# Patient Record
Sex: Female | Born: 1945 | Race: White | Hispanic: No | Smoking: Former smoker
Health system: Southern US, Community
[De-identification: ages and names within clinical notes are randomized; demographics above are authoritative.]

## PROBLEM LIST (undated history)

## (undated) DIAGNOSIS — Z8739 Personal history of other diseases of the musculoskeletal system and connective tissue: Secondary | ICD-10-CM

## (undated) DIAGNOSIS — E559 Vitamin D deficiency, unspecified: Secondary | ICD-10-CM

## (undated) DIAGNOSIS — R0683 Snoring: Secondary | ICD-10-CM

## (undated) DIAGNOSIS — F5104 Psychophysiologic insomnia: Secondary | ICD-10-CM

## (undated) DIAGNOSIS — G2581 Restless legs syndrome: Secondary | ICD-10-CM

## (undated) DIAGNOSIS — E039 Hypothyroidism, unspecified: Secondary | ICD-10-CM

## (undated) DIAGNOSIS — M8580 Other specified disorders of bone density and structure, unspecified site: Secondary | ICD-10-CM

## (undated) DIAGNOSIS — I10 Essential (primary) hypertension: Secondary | ICD-10-CM

## (undated) DIAGNOSIS — J302 Other seasonal allergic rhinitis: Secondary | ICD-10-CM

## (undated) DIAGNOSIS — M5412 Radiculopathy, cervical region: Secondary | ICD-10-CM

## (undated) DIAGNOSIS — I4811 Longstanding persistent atrial fibrillation: Secondary | ICD-10-CM

## (undated) HISTORY — DX: Other specified disorders of bone density and structure, unspecified site: M85.80

## (undated) HISTORY — DX: Snoring: R06.83

## (undated) HISTORY — PX: CHOLECYSTECTOMY: SHX55

## (undated) HISTORY — DX: Vitamin D deficiency, unspecified: E55.9

## (undated) HISTORY — PX: OTHER SURGICAL HISTORY: SHX169

## (undated) HISTORY — DX: Personal history of other diseases of the musculoskeletal system and connective tissue: Z87.39

## (undated) HISTORY — DX: Other seasonal allergic rhinitis: J30.2

## (undated) HISTORY — PX: DILATION AND CURETTAGE OF UTERUS: SHX78

## (undated) HISTORY — DX: Hypothyroidism, unspecified: E03.9

## (undated) HISTORY — DX: Psychophysiologic insomnia: F51.04

## (undated) HISTORY — DX: Radiculopathy, cervical region: M54.12

## (undated) HISTORY — DX: Restless legs syndrome: G25.81

## (undated) HISTORY — DX: Essential (primary) hypertension: I10

## (undated) HISTORY — DX: Longstanding persistent atrial fibrillation: I48.11

---

## 2014-08-20 ENCOUNTER — Other Ambulatory Visit: Payer: Self-pay | Admitting: Family Medicine

## 2014-08-20 DIAGNOSIS — Z1231 Encounter for screening mammogram for malignant neoplasm of breast: Secondary | ICD-10-CM

## 2014-08-22 ENCOUNTER — Ambulatory Visit
Admission: RE | Admit: 2014-08-22 | Discharge: 2014-08-22 | Disposition: A | Discharge status: Home / Self Care | Payer: Medicare Other | Source: Ambulatory Visit | Attending: Family Medicine | Admitting: Family Medicine

## 2014-08-22 ENCOUNTER — Encounter (INDEPENDENT_AMBULATORY_CARE_PROVIDER_SITE_OTHER): Payer: Self-pay

## 2014-08-22 DIAGNOSIS — Z1231 Encounter for screening mammogram for malignant neoplasm of breast: Secondary | ICD-10-CM

## 2016-02-29 ENCOUNTER — Ambulatory Visit (INDEPENDENT_AMBULATORY_CARE_PROVIDER_SITE_OTHER): Payer: Medicare Other | Admitting: Internal Medicine

## 2016-02-29 ENCOUNTER — Encounter (INDEPENDENT_AMBULATORY_CARE_PROVIDER_SITE_OTHER): Payer: Self-pay

## 2016-02-29 ENCOUNTER — Encounter: Payer: Self-pay | Admitting: Internal Medicine

## 2016-02-29 VITALS — BP 118/62 | HR 85 | Ht 61.0 in | Wt 166.0 lb

## 2016-02-29 DIAGNOSIS — I10 Essential (primary) hypertension: Secondary | ICD-10-CM

## 2016-02-29 DIAGNOSIS — I4891 Unspecified atrial fibrillation: Secondary | ICD-10-CM | POA: Diagnosis not present

## 2016-02-29 DIAGNOSIS — I481 Persistent atrial fibrillation: Secondary | ICD-10-CM

## 2016-02-29 DIAGNOSIS — I4819 Other persistent atrial fibrillation: Secondary | ICD-10-CM

## 2016-02-29 NOTE — Progress Notes (Signed)
Electrophysiology Office Note   Date:  02/29/2016   ID:  Cassidy CarinaShirley A Benavides, DOB 14-Jun-1945, MRN [id number]  PCP:  Thora LanceEHINGER,ROBERT R, MD  Cardiologist:  Dr Arita MissPace in [place] (years ago) Primary Electrophysiologist: Hillis RangeJames Findley Blankenbaker, MD    Chief Complaint  Patient presents with  . New Patient (Initial Visit)  . Atrial Fibrillation     History of Present Illness: Cassidy Rodriguez is a 70 y.o. female who presents today for electrophysiology evaluation.   She reports initially being diagnosed with atrial fibrillation 8-9 years ago.  This was diagnosed with afib in [place] (where she was living) after presenting with palpitations, wheezing, and edema.  She had decreased exercise tolerance at that time.  She was hospitalized and initiated on digoxin, atenolol and coumadin.  She did not have cardioversion.  She has done well since that time.  She is unaware of her afib.  She has probably been persistently in afib for several years.  Her coumadin has been switched to pradaxa several years ago and atenolol switched to metoprolol.  Her pradaxa was switched to xarelto due to once daily dosing but did not have issues with pradaxa previously.  She is pleased with her current health state.  She knows several people that have had afib ablation.  She presents to inquire about ablation to "get off of medicines".   Today, she denies symptoms of palpitations, chest pain, shortness of breath, orthopnea, PND, lower extremity edema, claudication, dizziness, presyncope, syncope, bleeding, or neurologic sequela. The patient is tolerating medications without difficulties and is otherwise without complaint today.    Past Medical History:  Diagnosis Date  . Brachial neuritis   . Chronic insomnia   . History of osteopenia   . Hypertension   . Hypothyroidism   . Osteopenia   . Persistent atrial fibrillation (HCC)   . Restless leg   . Seasonal allergies   . Vitamin D deficiency    Past Surgical History:  Procedure  Laterality Date  . BTL    . CHOLECYSTECTOMY    . DILATION AND CURETTAGE OF UTERUS       Current Outpatient Prescriptions  Medication Sig Dispense Refill  . Cholecalciferol (VITAMIN D3) 5000 units CAPS Take by mouth 3 (three) times daily.    Marland Kitchen. DIGOXIN PO Take 250 mcg by mouth daily.    Marland Kitchen. doxepin (SINEQUAN) 10 MG capsule Take 10 mg by mouth at bedtime as needed (1-2 CAPSULE).    Marland Kitchen. levothyroxine (SYNTHROID, LEVOTHROID) 75 MCG tablet Take 75 mcg by mouth daily before breakfast.    . METOPROLOL SUCCINATE ER PO Take 50 mg by mouth daily.    . rivaroxaban (XARELTO) 20 MG TABS tablet Take 20 mg by mouth daily with supper.     No current facility-administered medications for this visit.     Allergies:   Compazine [prochlorperazine edisylate]   Social History:  The patient  reports that she quit smoking about 27 years ago. She has never used smokeless tobacco. She reports that she drinks alcohol. She reports that she does not use drugs.   Family History:  The patient's  family history includes Colon polyps in her sister.    ROS:  Please see the history of present illness.   All other systems are reviewed and negative.    PHYSICAL EXAM: VS:  BP 118/62   Pulse 85   Ht 5\' 1"  (1.549 m)   Wt 166 lb (75.3 kg)   LMP  (LMP Unknown)   BMI  31.37 kg/m  , BMI Body mass index is 31.37 kg/m. GEN: Well nourished, well developed, in no acute distress  HEENT: normal  Neck: no JVD, carotid bruits, or masses Cardiac: iRRR; no murmurs, rubs, or gallops,no edema  Respiratory:  clear to auscultation bilaterally, normal work of breathing GI: soft, nontender, nondistended, + BS MS: no deformity or atrophy  Skin: warm and dry  Neuro:  Strength and sensation are intact Psych: euthymic mood, full affect  EKG:  EKG is ordered today. The ekg ordered today shows afib,  V rate 85 bpm, RBBB, LAD   Wt Readings from Last 3 Encounters:  02/29/16 166 lb (75.3 kg)      Other studies Reviewed: Additional  studies/ records that were reviewed today include: Dr Randel BooksEhinger's records  Review of the above records today demonstrates: as above   ASSESSMENT AND PLAN:  1.  Longstanding persistent atrial fibrillation The patient has asymptomatic and rate controlled afib.  She has been in afib for several years.  She is doing well currently. chads2vasc score is at least 3.  She is appropriately anticoagulated with xarelto.  She presents today for discussion of AF ablation simply to get off of anticoagulation/ medicines. Therapeutic strategies for afib including rate control and rhythm control were discussed in detail with the patient today. Risk, benefits, and alternatives to EP study and radiofrequency ablation for afib were also discussed in detail today.  I have explained that the only approved indication for AF ablation is for symptomatic relief.  I have also explained that given her longstanding persistent afib, her success with ablation would be low (<50% with multiple procedures required).  I was very clear today that she should continue her current medicines and not pursue ablation.  I did discuss Watchman LAAO as a possible alternative to anticoagulation.  She will consider this but admits that other than cost that she is doing well with xarelto. No changes are made today Would advise dig level with PCP every 6 months Echo to evaluate for structural changes related to AF.  2. Snoring I have encouraged her to discuss possibility of sleep study with PCP  3. HTN Stable No change required today  Follow-up in AF clinic every 6 months.  I will see as needed  Current medicines are reviewed at length with the patient today.   The patient does not have concerns regarding her medicines.  The following changes were made today:  none  Signed, Hillis RangeJames Keonia Pasko, MD  02/29/2016 3:53 PM     [facility] [address] [place] [address] [phone] (office) [phone] (fax)

## 2016-02-29 NOTE — Patient Instructions (Signed)
Medication Instructions:  Your physician recommends that you continue on your current medications as directed. Please refer to the Current Medication list given to you today.    Labwork: None ordered   Testing/Procedures: Your physician has requested that you have an echocardiogram. Echocardiography is a painless test that uses sound waves to create images of your heart. It provides your doctor with information about the size and shape of your heart and how well your heart's chambers and valves are working. This procedure takes approximately one hour. There are no restrictions for this procedure.    Follow-Up: Your physician wants you to follow-up in: 6 months with Rudi Cocoonna Carroll, NP  You will receive a reminder letter in the mail two months in advance. If you don't receive a letter, please call our office to schedule the follow-up appointment.    Any Other Special Instructions Will Be Listed Below (If Applicable).     If you need a refill on your cardiac medications before your next appointment, please call your pharmacy.

## 2016-03-21 ENCOUNTER — Other Ambulatory Visit: Payer: Self-pay

## 2016-03-21 ENCOUNTER — Ambulatory Visit (HOSPITAL_COMMUNITY): Payer: Medicare Other | Attending: Cardiovascular Disease

## 2016-03-21 DIAGNOSIS — I119 Hypertensive heart disease without heart failure: Secondary | ICD-10-CM | POA: Insufficient documentation

## 2016-03-21 DIAGNOSIS — Z87891 Personal history of nicotine dependence: Secondary | ICD-10-CM | POA: Diagnosis not present

## 2016-03-21 DIAGNOSIS — I4891 Unspecified atrial fibrillation: Secondary | ICD-10-CM

## 2016-03-21 DIAGNOSIS — I340 Nonrheumatic mitral (valve) insufficiency: Secondary | ICD-10-CM | POA: Insufficient documentation

## 2016-07-08 ENCOUNTER — Telehealth: Payer: Self-pay | Admitting: Internal Medicine

## 2016-07-08 NOTE — Telephone Encounter (Signed)
Follow Up;   Cassidy Rodriguez says she faxed over 3 clearance and have not received one back yet.Please fax,asap,pt is scheduled for surgery on Tuesday.

## 2016-07-08 NOTE — Telephone Encounter (Signed)
Recommendations faxed over this morning

## 2016-08-19 ENCOUNTER — Telehealth (HOSPITAL_COMMUNITY): Payer: Self-pay | Admitting: *Deleted

## 2016-08-19 NOTE — Telephone Encounter (Signed)
I called pt to schedule 6 month recall.  Pt declined the appt at this time stating that since she is not having any problem she does not feel the appt to be necessary.  Pt advised to keep in contact with Dr. Amedeo Plenty office to keep follow ups for medication refills and general follow up

## 2017-02-15 ENCOUNTER — Ambulatory Visit (INDEPENDENT_AMBULATORY_CARE_PROVIDER_SITE_OTHER): Payer: Medicare Other | Admitting: Podiatry

## 2017-02-15 ENCOUNTER — Ambulatory Visit (INDEPENDENT_AMBULATORY_CARE_PROVIDER_SITE_OTHER): Payer: Medicare Other

## 2017-02-15 ENCOUNTER — Encounter: Payer: Self-pay | Admitting: Podiatry

## 2017-02-15 DIAGNOSIS — M722 Plantar fascial fibromatosis: Secondary | ICD-10-CM | POA: Diagnosis not present

## 2017-02-15 DIAGNOSIS — G4700 Insomnia, unspecified: Secondary | ICD-10-CM | POA: Insufficient documentation

## 2017-02-15 DIAGNOSIS — E559 Vitamin D deficiency, unspecified: Secondary | ICD-10-CM | POA: Insufficient documentation

## 2017-02-15 DIAGNOSIS — G4726 Circadian rhythm sleep disorder, shift work type: Secondary | ICD-10-CM | POA: Insufficient documentation

## 2017-02-15 DIAGNOSIS — I10 Essential (primary) hypertension: Secondary | ICD-10-CM | POA: Insufficient documentation

## 2017-02-15 DIAGNOSIS — I4891 Unspecified atrial fibrillation: Secondary | ICD-10-CM | POA: Insufficient documentation

## 2017-02-15 DIAGNOSIS — E039 Hypothyroidism, unspecified: Secondary | ICD-10-CM | POA: Insufficient documentation

## 2017-02-15 DIAGNOSIS — M8580 Other specified disorders of bone density and structure, unspecified site: Secondary | ICD-10-CM | POA: Insufficient documentation

## 2017-02-15 DIAGNOSIS — G479 Sleep disorder, unspecified: Secondary | ICD-10-CM | POA: Insufficient documentation

## 2017-02-15 DIAGNOSIS — G2581 Restless legs syndrome: Secondary | ICD-10-CM | POA: Insufficient documentation

## 2017-02-15 MED ORDER — NONFORMULARY OR COMPOUNDED ITEM: 120.0000 g | Freq: Four times a day (QID) | 2 refills | Status: DC

## 2017-02-15 MED ORDER — BETAMETHASONE SOD PHOS & ACET 6 (3-3) MG/ML IJ SUSP: 3.0000 mg | Freq: Once | INTRAMUSCULAR | Status: DC

## 2017-02-18 NOTE — Progress Notes (Signed)
   Subjective: Patient presents today for burning, throbbing pain and tenderness in the right heel that began about one month ago. Patient states that it hurts in the mornings with the first steps out of bed. She also reports that she began Warfarin about three months ago and has been experiencing burning in the RLE since. Patient presents today for further treatment and evaluation.   Past Medical History:  Diagnosis Date  . Brachial neuritis   . Chronic insomnia   . History of osteopenia   . Hypertension   . Hypothyroidism   . Longstanding persistent atrial fibrillation (HCC)   . Osteopenia   . Restless leg   . Seasonal allergies   . Snoring   . Vitamin D deficiency      Objective: Physical Exam General: The patient is alert and oriented x3 in no acute distress.  Dermatology: Skin is warm, dry and supple bilateral lower extremities. Negative for open lesions or macerations bilateral.   Vascular: Dorsalis Pedis and Posterior Tibial pulses palpable bilateral.  Capillary fill time is immediate to all digits.  Neurological: Epicritic and protective threshold intact bilateral.   Musculoskeletal: Tenderness to palpation at the medial calcaneal tubercale and through the insertion of the plantar fascia of the right foot. All other joints range of motion within normal limits bilateral. Strength 5/5 in all groups bilateral.   Radiographic exam: Normal osseous mineralization. Joint spaces preserved. No fracture/dislocation/boney destruction. Calcaneal spur present with mild thickening of plantar fascia right. No other soft tissue abnormalities or radiopaque foreign bodies.   Assessment: 1. Plantar fasciitis right 2. Pain in right foot  Plan of Care:  1. Patient evaluated. Xrays reviewed.   2. Injection of 0.5cc Celestone soluspan injected into the right plantar fascia  3. Plantar fascial band(s) dispensed 4. Instructed patient regarding therapies and modalities at home to alleviate  symptoms.  5. Cannot tolerate oral NSAIDs. Patient is on Coumadin. 6. Prescription for pain cream to be dispensed [facility]. 7. Return to clinic in 4 weeks.     Felecia ShellingBrent M. Dimetri Armitage, DPM [facility]  Dr. Felecia ShellingBrent M. Freedom Lopezperez, DPM    [address]                                        [place], [place] [address]                Office [phone]  Fax [phone]

## 2017-03-07 ENCOUNTER — Other Ambulatory Visit: Payer: Self-pay | Admitting: Family Medicine

## 2017-03-07 ENCOUNTER — Ambulatory Visit
Admission: RE | Admit: 2017-03-07 | Discharge: 2017-03-07 | Disposition: A | Discharge status: Home / Self Care | Payer: Medicare Other | Source: Ambulatory Visit | Attending: Family Medicine | Admitting: Family Medicine

## 2017-03-07 DIAGNOSIS — Z1231 Encounter for screening mammogram for malignant neoplasm of breast: Secondary | ICD-10-CM

## 2017-03-15 ENCOUNTER — Encounter: Payer: Self-pay | Admitting: Podiatry

## 2017-03-15 ENCOUNTER — Ambulatory Visit: Payer: Medicare Other | Admitting: Podiatry

## 2017-03-15 DIAGNOSIS — M722 Plantar fascial fibromatosis: Secondary | ICD-10-CM

## 2017-03-21 NOTE — Progress Notes (Signed)
   Subjective: Patient presents today for follow up evaluation of right plantar fasciitis. She states the foot pain has improved some but is still present. She states wearing the fascial band and receiving the injection at the previous visit helped alleviate the pain. She is unsure if the pain cream from [facility] has provided any relief and states it was expensive. Patient presents today for further treatment and evaluation.   Past Medical History:  Diagnosis Date  . Brachial neuritis   . Chronic insomnia   . History of osteopenia   . Hypertension   . Hypothyroidism   . Longstanding persistent atrial fibrillation (HCC)   . Osteopenia   . Restless leg   . Seasonal allergies   . Snoring   . Vitamin D deficiency      Objective: Physical Exam General: The patient is alert and oriented x3 in no acute distress.  Dermatology: Skin is warm, dry and supple bilateral lower extremities. Negative for open lesions or macerations bilateral.   Vascular: Dorsalis Pedis and Posterior Tibial pulses palpable bilateral.  Capillary fill time is immediate to all digits.  Neurological: Epicritic and protective threshold intact bilateral.   Musculoskeletal: Tenderness to palpation at the medial calcaneal tubercale and through the insertion of the plantar fascia of the right foot. All other joints range of motion within normal limits bilateral. Strength 5/5 in all groups bilateral.    Assessment: 1. Plantar fasciitis right 2. Pain in right foot  Plan of Care:  1. Patient evaluated.   2. Injection of 0.5cc Celestone soluspan injected into the right plantar fascia  3. Continue wearing plantar fascial brace and [facility] cream [facility]. 4. Cannot tolerate oral NSAIDs. Patient is on Coumadin. 5. Return to clinic in 4 weeks.     Felecia ShellingBrent M. Soraida Vickers, DPM [facility]  Dr. Felecia ShellingBrent M. Jaquille Kau, DPM    [address]                                        [place],  [place] [address]                Office [phone]  Fax [phone]

## 2017-04-12 ENCOUNTER — Ambulatory Visit: Payer: Medicare Other | Admitting: Podiatry

## 2017-09-13 ENCOUNTER — Emergency Department (HOSPITAL_COMMUNITY)
Admission: EM | Admit: 2017-09-13 | Discharge: 2017-09-14 | Disposition: A | Discharge status: Home / Self Care | Payer: Medicare Other | Attending: Emergency Medicine | Admitting: Emergency Medicine

## 2017-09-13 ENCOUNTER — Other Ambulatory Visit: Payer: Self-pay

## 2017-09-13 ENCOUNTER — Encounter (HOSPITAL_COMMUNITY): Payer: Self-pay

## 2017-09-13 DIAGNOSIS — E039 Hypothyroidism, unspecified: Secondary | ICD-10-CM | POA: Insufficient documentation

## 2017-09-13 DIAGNOSIS — N83202 Unspecified ovarian cyst, left side: Secondary | ICD-10-CM | POA: Insufficient documentation

## 2017-09-13 DIAGNOSIS — Z7901 Long term (current) use of anticoagulants: Secondary | ICD-10-CM

## 2017-09-13 DIAGNOSIS — Z79899 Other long term (current) drug therapy: Secondary | ICD-10-CM | POA: Diagnosis not present

## 2017-09-13 DIAGNOSIS — N949 Unspecified condition associated with female genital organs and menstrual cycle: Secondary | ICD-10-CM

## 2017-09-13 DIAGNOSIS — R1031 Right lower quadrant pain: Secondary | ICD-10-CM | POA: Diagnosis present

## 2017-09-13 DIAGNOSIS — Z87891 Personal history of nicotine dependence: Secondary | ICD-10-CM | POA: Insufficient documentation

## 2017-09-13 DIAGNOSIS — I10 Essential (primary) hypertension: Secondary | ICD-10-CM | POA: Diagnosis not present

## 2017-09-13 DIAGNOSIS — I4891 Unspecified atrial fibrillation: Secondary | ICD-10-CM | POA: Diagnosis not present

## 2017-09-13 LAB — URINALYSIS, ROUTINE W REFLEX MICROSCOPIC
BILIRUBIN URINE: NEGATIVE
Glucose, UA: NEGATIVE mg/dL
Hgb urine dipstick: NEGATIVE
KETONES UR: NEGATIVE mg/dL
NITRITE: NEGATIVE
PH: 5 (ref 5.0–8.0)
Protein, ur: NEGATIVE mg/dL
Specific Gravity, Urine: 1.015 (ref 1.005–1.030)

## 2017-09-13 LAB — COMPREHENSIVE METABOLIC PANEL
ALT: 14 U/L (ref 14–54)
ANION GAP: 11 (ref 5–15)
AST: 19 U/L (ref 15–41)
Albumin: 3.9 g/dL (ref 3.5–5.0)
Alkaline Phosphatase: 64 U/L (ref 38–126)
BILIRUBIN TOTAL: 0.7 mg/dL (ref 0.3–1.2)
BUN: 10 mg/dL (ref 6–20)
CO2: 22 mmol/L (ref 22–32)
Calcium: 9.4 mg/dL (ref 8.9–10.3)
Chloride: 105 mmol/L (ref 101–111)
Creatinine, Ser: 0.84 mg/dL (ref 0.44–1.00)
GFR calc Af Amer: >60 mL/min (ref >60)
Glucose, Bld: 108 mg/dL — ABNORMAL HIGH (ref 65–99)
POTASSIUM: 4.2 mmol/L (ref 3.5–5.1)
Sodium: 138 mmol/L (ref 135–145)
TOTAL PROTEIN: 7.5 g/dL (ref 6.5–8.1)

## 2017-09-13 LAB — CBC
HEMATOCRIT: 41.2 % (ref 36.0–46.0)
HEMOGLOBIN: 13.5 g/dL (ref 12.0–15.0)
MCH: 31 pg (ref 26.0–34.0)
MCHC: 32.8 g/dL (ref 30.0–36.0)
MCV: 94.5 fL (ref 78.0–100.0)
Platelets: 239 10*3/uL (ref 150–400)
RBC: 4.36 MIL/uL (ref 3.87–5.11)
RDW: 12.7 % (ref 11.5–15.5)
WBC: 4.6 10*3/uL (ref 4.0–10.5)

## 2017-09-13 LAB — LIPASE, BLOOD: Lipase: 23 U/L (ref 11–51)

## 2017-09-13 NOTE — ED Triage Notes (Signed)
Pt endorses RLQ pain since yesterday morning,. Sent by Deboraha Sprang MD for appendicitis rule out. VSS. Denies vomiting or diarrhea.

## 2017-09-14 ENCOUNTER — Emergency Department (HOSPITAL_COMMUNITY): Payer: Medicare Other

## 2017-09-14 LAB — CBC WITH DIFFERENTIAL/PLATELET
ABS IMMATURE GRANULOCYTES: 0 10*3/uL (ref 0.0–0.1)
Basophils Absolute: 0 10*3/uL (ref 0.0–0.1)
Basophils Relative: 1 %
EOS ABS: 0.2 10*3/uL (ref 0.0–0.7)
Eosinophils Relative: 4 %
HEMATOCRIT: 41.2 % (ref 36.0–46.0)
Hemoglobin: 13.6 g/dL (ref 12.0–15.0)
IMMATURE GRANULOCYTES: 0 %
LYMPHS ABS: 1.8 10*3/uL (ref 0.7–4.0)
Lymphocytes Relative: 36 %
MCH: 30.9 pg (ref 26.0–34.0)
MCHC: 33 g/dL (ref 30.0–36.0)
MCV: 93.6 fL (ref 78.0–100.0)
MONO ABS: 0.5 10*3/uL (ref 0.1–1.0)
MONOS PCT: 10 %
NEUTROS ABS: 2.5 10*3/uL (ref 1.7–7.7)
NEUTROS PCT: 49 %
Platelets: 212 10*3/uL (ref 150–400)
RBC: 4.4 MIL/uL (ref 3.87–5.11)
RDW: 12.7 % (ref 11.5–15.5)
WBC: 5.1 10*3/uL (ref 4.0–10.5)

## 2017-09-14 LAB — PROTIME-INR
INR: 1.91
Prothrombin Time: 21.7 seconds — ABNORMAL HIGH (ref 11.4–15.2)

## 2017-09-14 MED ORDER — OXYCODONE-ACETAMINOPHEN 5-325 MG PO TABS: 1.0000 | ORAL_TABLET | ORAL | 0 refills | Status: DC | PRN

## 2017-09-14 MED ORDER — IOHEXOL 300 MG/ML  SOLN
100.0000 mL | Freq: Once | INTRAMUSCULAR | Status: AC | PRN
  Administered 2017-09-14: 100 mL via INTRAVENOUS

## 2017-09-14 NOTE — ED Provider Notes (Signed)
[facility] EMERGENCY DEPARTMENT Provider Note   CSN: [id number] Arrival date & time: 09/13/17  1755     History   Chief Complaint Chief Complaint  Patient presents with  . Abdominal Pain    HPI Cassidy Rodriguez is a 72 y.o. female.  The history is provided by the patient.  Abdominal Pain    She has history of hypertension and atrial fibrillation and is anticoagulated with warfarin.  She had onset May 21 and pain in the right mid abdomen with radiation to the back.  Pain was severe and she rated it at 10/10.  There is no associated nausea or vomiting.  She denies any urinary difficulty.  She had been constipated for the past week.  She denies any diarrhea.  She denies fever or chills.  Appetite has been normal.  Pain subsided slightly on May 22, and she went into see her PCP who sent her to the ED to rule out appendicitis.  She is status post cholecystectomy.  Pain is worse with movement and with taking deep breath.  Nothing makes it better.  Past Medical History:  Diagnosis Date  . Brachial neuritis   . Chronic insomnia   . History of osteopenia   . Hypertension   . Hypothyroidism   . Longstanding persistent atrial fibrillation (HCC)   . Osteopenia   . Restless leg   . Seasonal allergies   . Snoring   . Vitamin D deficiency     Patient Active Problem List   Diagnosis Date Noted  . Atrial fibrillation (HCC) 02/15/2017  . Circadian rhythm sleep disorder, shift work type 02/15/2017  . Essential hypertension 02/15/2017  . Hypothyroidism 02/15/2017  . Insomnia 02/15/2017  . Restless legs syndrome 02/15/2017  . Senile osteopenia 02/15/2017  . Sleep disturbance 02/15/2017  . Vitamin D deficiency 02/15/2017    Past Surgical History:  Procedure Laterality Date  . BTL    . CHOLECYSTECTOMY    . DILATION AND CURETTAGE OF UTERUS       OB History   None      Home Medications    Prior to Admission medications   Medication Sig Start Date End  Date Taking? Authorizing Provider  Cholecalciferol (VITAMIN D3) 5000 units CAPS Take by mouth 3 (three) times daily.    [provider]  DIGOXIN PO Take 250 mcg by mouth daily.    [provider]  doxepin (SINEQUAN) 10 MG capsule Take 10 mg by mouth at bedtime as needed (1-2 CAPSULE).    [provider]  levothyroxine (SYNTHROID, LEVOTHROID) 75 MCG tablet Take 75 mcg by mouth daily before breakfast.    [provider]  METOPROLOL SUCCINATE ER PO Take 50 mg by mouth daily.    [provider]  NONFORMULARY OR COMPOUNDED ITEM Apply 120 g topically 4 (four) times daily. Diclofenac 3% Baclofen 2%, Bupivacaine 1%, Gabapentin 6% Ibuprofen 3%, Pentoxifylline 3% 02/15/17   Logan Bores, Larena Glassman, DPM  rivaroxaban (XARELTO) 20 MG TABS tablet Take 20 mg by mouth daily with supper.    [provider]  warfarin (COUMADIN) 5 MG tablet Take 5 mg by mouth daily.    [provider]    Family History Family History  Problem Relation Age of Onset  . Colon polyps Sister     Social History Social History   Tobacco Use  . Smoking status: Former Smoker    Last attempt to quit: 02/25/1989    Years since quitting: 28.5  .  Smokeless tobacco: Never Used  Substance Use Topics  . Alcohol use: Yes    Comment: 1-2 glasses of red wine with dinner  . Drug use: No     Allergies   Compazine [prochlorperazine edisylate]   Review of Systems Review of Systems  Gastrointestinal: Positive for abdominal pain.  All other systems reviewed and are negative.    Physical Exam Updated Vital Signs BP (!) 153/113   Pulse 76   Temp 97.8 F (36.6 C) (Oral)   Resp 13   Ht  (1.575 m)   Wt 74.8 kg (165 lb)   LMP  (LMP Unknown)   SpO2 100%   BMI 30.18 kg/m   Physical Exam  Nursing note and vitals reviewed.  72 year old female, resting comfortably and in no acute distress. Vital signs are significant for elevated blood pressure. Oxygen saturation is  100%, which is normal. Head is normocephalic and atraumatic. PERRLA, EOMI. Oropharynx is clear. Neck is nontender and supple without adenopathy or JVD. Back is nontender and there is no CVA tenderness. Lungs are clear without rales, wheezes, or rhonchi. Chest is nontender. Heart has regular rate and rhythm without murmur. Abdomen is soft, flat, with marked tenderness in the right side of the abdomen.  Maximum tenderness is at McBurney's area.  There is no rebound or guarding.  There is mild to moderate tenderness with percussion over that area.  There are no masses or hepatosplenomegaly and peristalsis is hypoactive. Extremities have no cyanosis or edema, full range of motion is present. Skin is warm and dry without rash. Neurologic: Mental status is normal, cranial nerves are intact, there are no motor or sensory deficits.  ED Treatments / Results  Labs (all labs ordered are listed, but only abnormal results are displayed) Labs Reviewed  COMPREHENSIVE METABOLIC PANEL - Abnormal; Notable for the following components:      Result Value   Glucose, Bld 108 (*)    All other components within normal limits  URINALYSIS, ROUTINE W REFLEX MICROSCOPIC - Abnormal; Notable for the following components:   APPearance CLOUDY (*)    Leukocytes, UA LARGE (*)    Bacteria, UA RARE (*)    All other components within normal limits  PROTIME-INR - Abnormal; Notable for the following components:   Prothrombin Time 21.7 (*)    All other components within normal limits  LIPASE, BLOOD  CBC  CBC WITH DIFFERENTIAL/PLATELET    Radiology Ct Abdomen Pelvis W Contrast  Result Date: 09/14/2017 CLINICAL DATA:  Right lower quadrant pain since yesterday. Appendicitis suspected. EXAM: CT ABDOMEN AND PELVIS WITH CONTRAST TECHNIQUE: Multidetector CT imaging of the abdomen and pelvis was performed using the standard protocol following bolus administration of intravenous contrast. Repeat scan due to injector malfunction.  CONTRAST:  OMNIPAQUE IOHEXOL 300 MG/ML  SOLN COMPARISON:  None. FINDINGS: Lower chest: Mild cardiomegaly with right heart dilatation. Right Bochdalek hernia contains fat and a portion of the right adrenal gland. Hepatobiliary: No focal hepatic lesion. Clips in the gallbladder fossa postcholecystectomy. No biliary dilatation. Pancreas: Parenchymal atrophy. No ductal dilatation or inflammation. Spleen: Calcified granuloma.  Normal in size. Adrenals/Urinary Tract: Right adrenal gland partially extends into a Bochdalek hernia. Left adrenal gland is normal. No hydronephrosis or perinephric edema. Homogeneous renal enhancement with symmetric excretion on delayed phase imaging. 19 mm simple cyst in the mid posterior left kidney. Urinary bladder is physiologically distended without wall thickening. Stomach/Bowel: No appendicitis. Appendix tentatively identified and normal, image 66 series 7. Regardless, no  pericecal or right lower quadrant inflammation. No bowel wall thickening, inflammatory change or obstruction. Small to moderate colonic stool burden. Stomach is nondistended. Vascular/Lymphatic: Minimal aortic atherosclerosis without aneurysm. No enlarged abdominal or pelvic lymph nodes. Reproductive: Uterus is unremarkable. Right ovary is normal. Left ovary not well visualized due to bowel in the left adnexa. 3.7 cm ovoid fluid density structure in the left lower quadrant may be an ovarian cyst versus fluid-filled bowel. Other: No free air, free fluid, or intra-abdominal fluid collection. Musculoskeletal: There are no acute or suspicious osseous abnormalities. Facet hypertrophy in the lower lumbar spine. IMPRESSION: 1. No evidence of appendicitis. No acute findings in the abdomen/pelvis. 2. Left adnexal cyst measuring 3.7 cm versus fluid-filled bowel in the left lower quadrant. Given postmenopausal status in size greater than 3 cm, recommend nonemergent pelvic ultrasound for further evaluation. This recommendation  follows ACR consensus guidelines: White Paper of the ACR Incidental Findings Committee II on Adnexal Findings. J Am Coll Radiol [phone]. 3. Incidental right Bochdalek hernia containing fat and portion of the right adrenal gland. Electronically Signed   By: Rubye Oaks M.D.   On: 09/14/2017 06:46    Procedures Procedures   Medications Ordered in ED Medications  iohexol (OMNIPAQUE) 300 MG/ML solution 100 mL (100 mLs Intravenous Contrast Given 09/14/17 0540)     Initial Impression / Assessment and Plan / ED Course  I have reviewed the triage vital signs and the nursing notes.  Pertinent labs & imaging results that were available during my care of the patient were reviewed by me and considered in my medical decision making (see chart for details).  Right-sided abdominal pain which is worrisome for appendicitis.  Labs have been drawn prior to my seeing the patient and include normal WBC and normal lipase and normal liver function tests.  Urinalysis shows no evidence of UTI, no hematuria.  Differential includes appendicitis, urolithiasis, pancreatitis, diverticulitis.  Also, with patient on warfarin, consider intra-abdominal bleeding.  Hemoglobin is stable indicating no significant bleeding.  She will be sent for CT of abdomen and pelvis.  She is currently declining any narcotic pain medication.  CT scan shows no evidence of appendicitis.  Incidental finding of 3.7 centimeter left adnexal mass with radiology recommendation for outpatient ultrasound.  I have explained this to the patient.  Because of her pain is not clear, but no evidence of serious pathology at this point.  She is discharged with a prescription for small number of oxycodone-acetaminophen tablets, told to return in 24 hours for recheck if pain is not improving.  Final Clinical Impressions(s) / ED Diagnoses   Final diagnoses:  RLQ abdominal pain  Adnexal cyst  Anticoagulated on warfarin    ED Discharge Orders         Ordered    oxyCODONE-acetaminophen (PERCOCET) 5-325 MG tablet  Every 4 hours PRN     09/14/17 0865       Dione Booze, MD 09/14/17 [phone]

## 2017-09-14 NOTE — ED Notes (Signed)
Pt ambulated to the restroom unassisted 

## 2017-09-14 NOTE — ED Notes (Signed)
D/c reviewed with patient 

## 2017-09-14 NOTE — Discharge Instructions (Addendum)
Your CT scan and blood work did not show what is causing your pain, but there is no sign of appendicitis. If pain is not improving in 24 hours, then either return to the Emergency Department, or see your doctor for a recheck.  Your CT scan showed a 3.7 cm cyst near your left ovary. Radiologist recommends you get an ultrasound to get a better look at it.

## 2017-10-06 ENCOUNTER — Other Ambulatory Visit: Payer: Self-pay | Admitting: Family Medicine

## 2017-10-06 DIAGNOSIS — N949 Unspecified condition associated with female genital organs and menstrual cycle: Secondary | ICD-10-CM

## 2017-10-17 ENCOUNTER — Ambulatory Visit
Admission: RE | Admit: 2017-10-17 | Discharge: 2017-10-17 | Disposition: A | Discharge status: Home / Self Care | Payer: Medicare Other | Source: Ambulatory Visit | Attending: Family Medicine | Admitting: Family Medicine

## 2017-10-17 DIAGNOSIS — N949 Unspecified condition associated with female genital organs and menstrual cycle: Secondary | ICD-10-CM

## 2017-12-11 ENCOUNTER — Ambulatory Visit: Payer: Medicare Other | Admitting: Podiatry

## 2017-12-11 ENCOUNTER — Ambulatory Visit (INDEPENDENT_AMBULATORY_CARE_PROVIDER_SITE_OTHER): Payer: Medicare Other

## 2017-12-11 DIAGNOSIS — M79674 Pain in right toe(s): Secondary | ICD-10-CM | POA: Diagnosis not present

## 2017-12-11 DIAGNOSIS — M7751 Other enthesopathy of right foot: Secondary | ICD-10-CM

## 2017-12-11 DIAGNOSIS — M7989 Other specified soft tissue disorders: Secondary | ICD-10-CM

## 2017-12-11 DIAGNOSIS — M779 Enthesopathy, unspecified: Secondary | ICD-10-CM | POA: Diagnosis not present

## 2017-12-13 NOTE — Progress Notes (Signed)
   HPI: 72 year old female presenting today with a chief complaint of right fifth toe pain that began several weeks ago. She states she is unable to wear any closed toe shoes anymore because it makes the pain too severe. She has not done anything for treatment. Patient is here for further evaluation and treatment.   Past Medical History:  Diagnosis Date  . Brachial neuritis   . Chronic insomnia   . History of osteopenia   . Hypertension   . Hypothyroidism   . Longstanding persistent atrial fibrillation (HCC)   . Osteopenia   . Restless leg   . Seasonal allergies   . Snoring   . Vitamin D deficiency      Physical Exam: General: The patient is alert and oriented x3 in no acute distress.  Dermatology: Skin is warm, dry and supple bilateral lower extremities. Negative for open lesions or macerations.  Vascular: Palpable pedal pulses bilaterally. No edema or erythema noted. Capillary refill within normal limits.  Neurological: Epicritic and protective threshold grossly intact bilaterally.   Musculoskeletal Exam: Pain with palpation to the right fifth toe. Range of motion within normal limits to all pedal and ankle joints bilateral. Muscle strength 5/5 in all groups bilateral.   Radiographic Exam:  Normal osseous mineralization. Joint spaces preserved. No fracture/dislocation/boney destruction.    Assessment: 1. Right fifth toe capsulitis    Plan of Care:  1. Patient evaluated. X-Rays reviewed.  2. Silicone toe cap dispensed for cushioning.  3. Recommended wide fitting shoes.  4. Return to clinic as needed.        Felecia ShellingBrent M. Nautika Cressey, DPM [facility]  Dr. Felecia ShellingBrent M. Jedadiah Abdallah, DPM    [address]                                        [place], [place] [id number]                Office [phone]  Fax [phone]

## 2018-10-10 ENCOUNTER — Other Ambulatory Visit: Payer: Self-pay | Admitting: Family Medicine

## 2018-10-10 ENCOUNTER — Other Ambulatory Visit: Payer: Self-pay

## 2018-10-10 DIAGNOSIS — N949 Unspecified condition associated with female genital organs and menstrual cycle: Secondary | ICD-10-CM

## 2018-10-23 ENCOUNTER — Ambulatory Visit
Admission: RE | Admit: 2018-10-23 | Discharge: 2018-10-23 | Disposition: A | Discharge status: Home / Self Care | Payer: Medicare Other | Source: Ambulatory Visit | Attending: Family Medicine | Admitting: Family Medicine

## 2018-10-23 DIAGNOSIS — N949 Unspecified condition associated with female genital organs and menstrual cycle: Secondary | ICD-10-CM

## 2019-04-12 ENCOUNTER — Other Ambulatory Visit: Payer: Self-pay | Admitting: Family Medicine

## 2019-04-12 DIAGNOSIS — Z1231 Encounter for screening mammogram for malignant neoplasm of breast: Secondary | ICD-10-CM

## 2019-04-22 IMAGING — US US PELVIS COMPLETE TRANSABD/TRANSVAG
1 series · 14 of 25 positions shown · non-contrast
Comparison: 09/14/2017

CLINICAL DATA: Left adnexal cyst on prior CT



[Series 1: us pelvis complete transabd/transvag · 0.22mm/px · 14 of 48 slices shown]
[im 1/48]
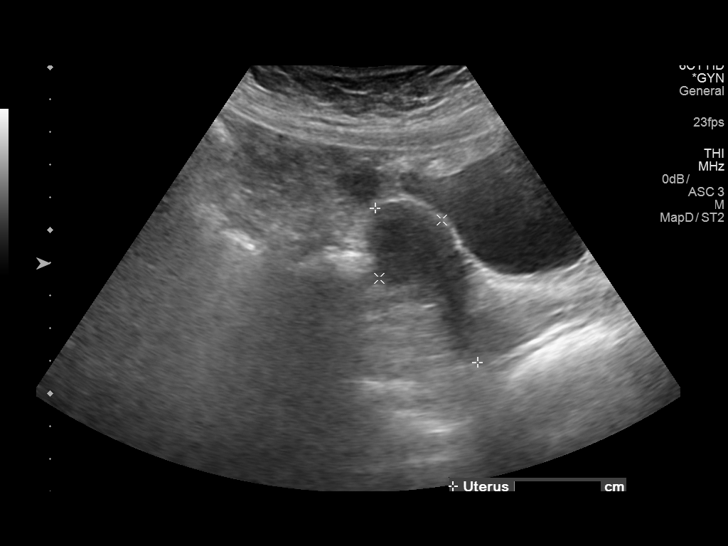
[im 4/48]
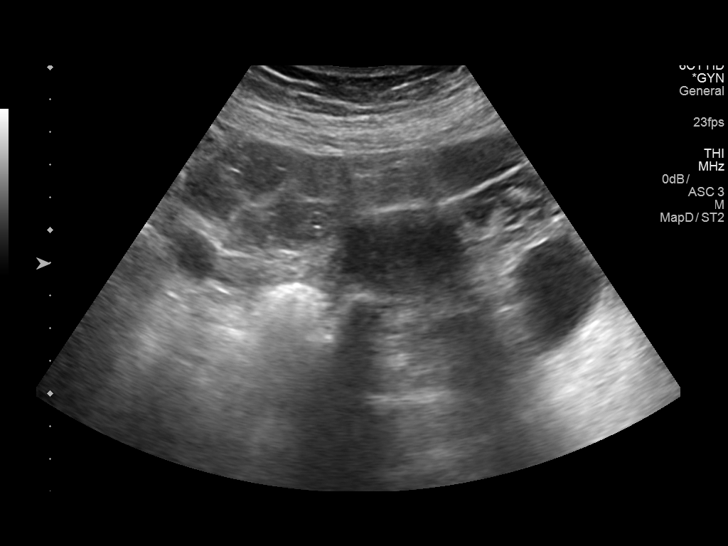
[im 8/48]
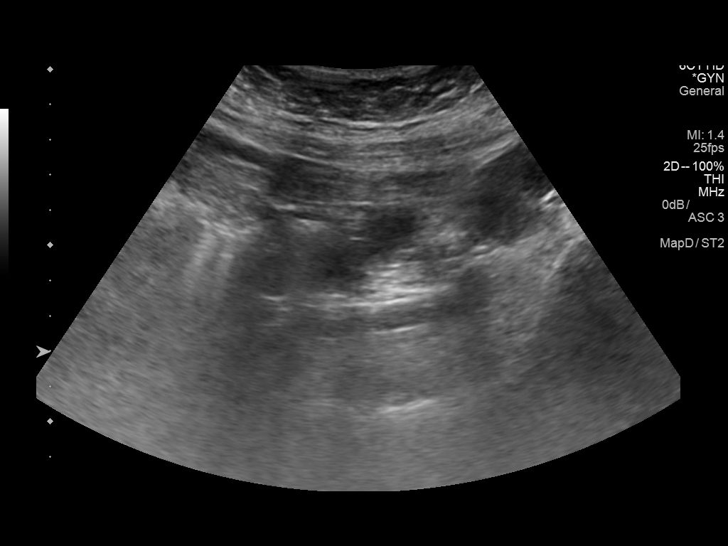
[im 12/48]
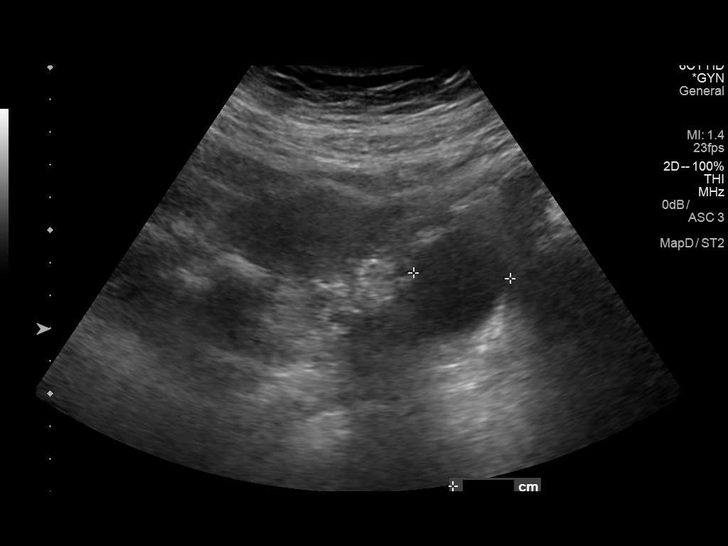
[im 16/48]
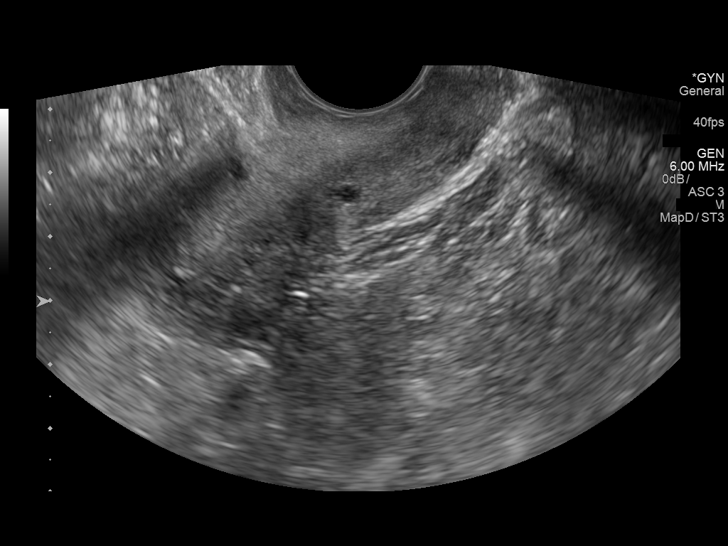
[im 18/48]
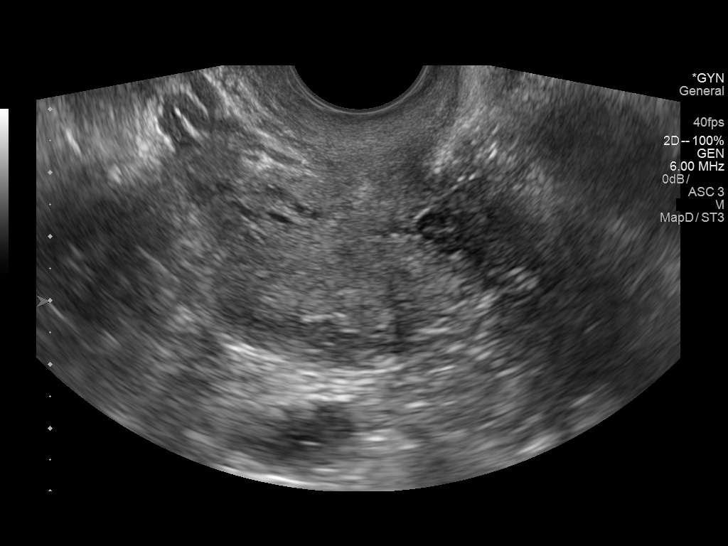
[im 22/48]
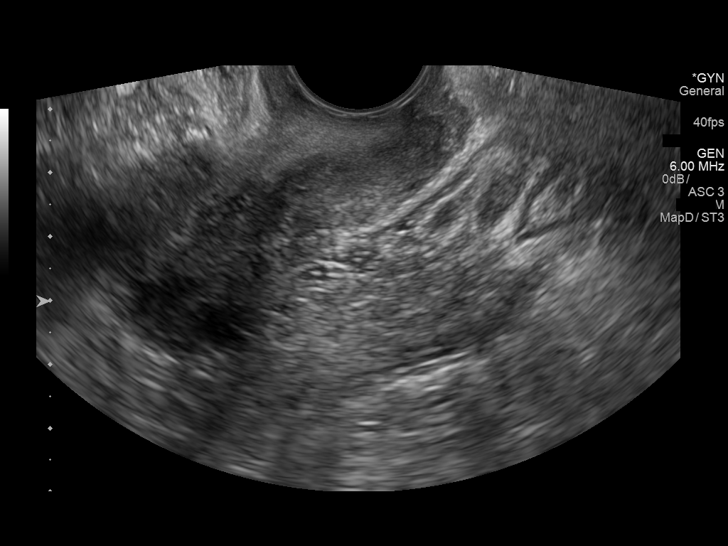
[im 26/48]
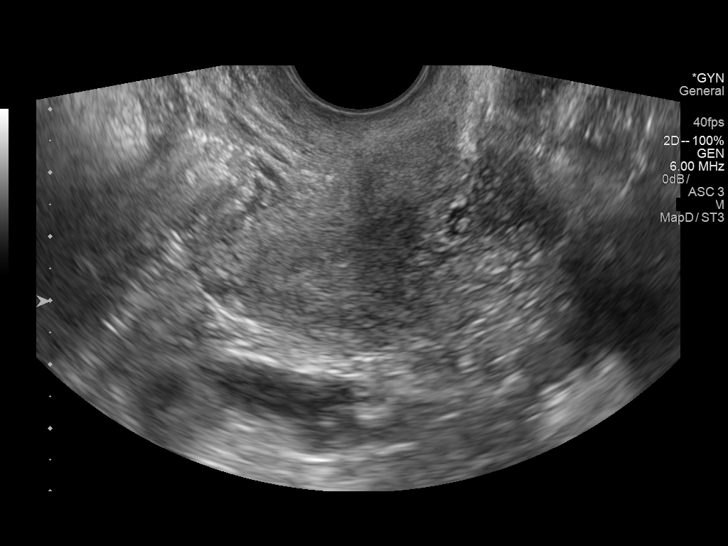
[im 30/48]
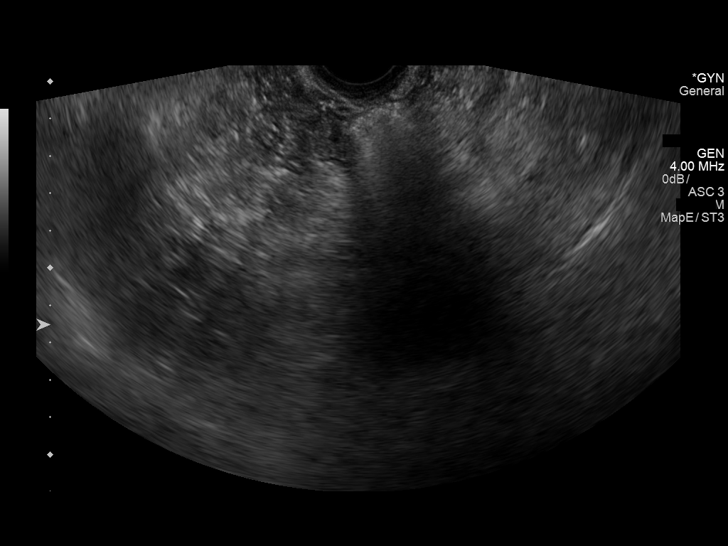
[im 32/48]
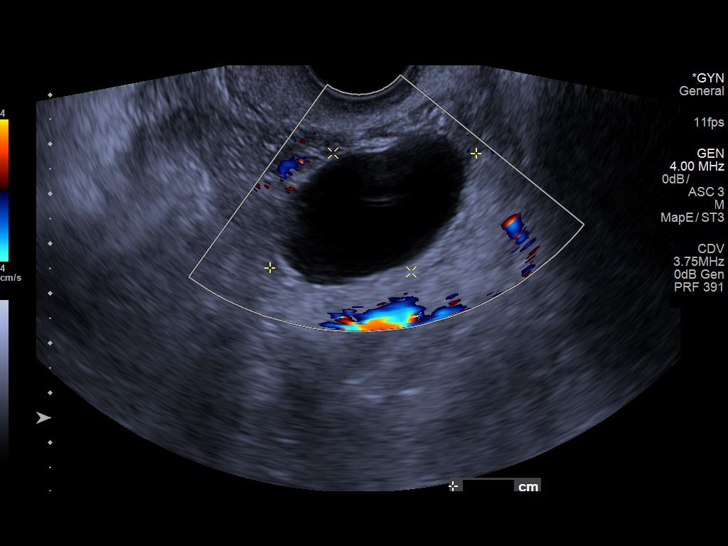
[im 36/48]
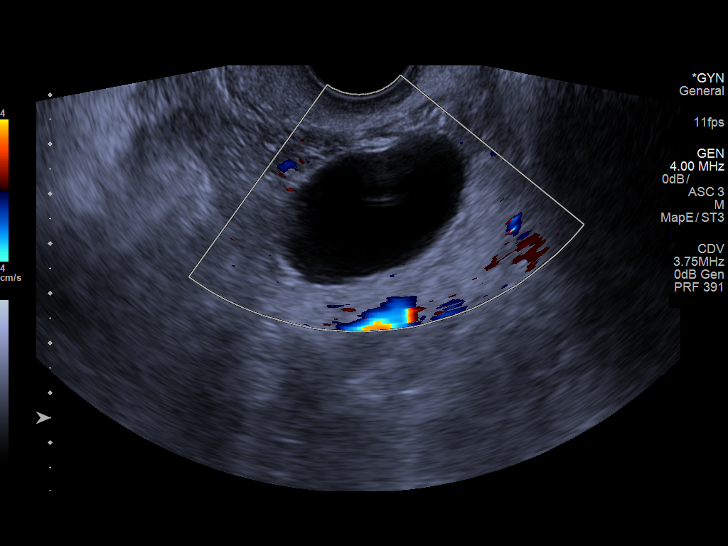
[im 40/48]
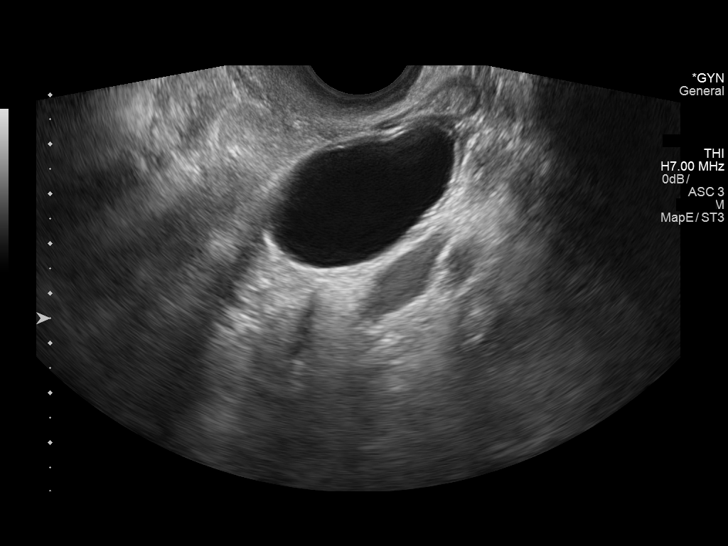
[im 44/48]
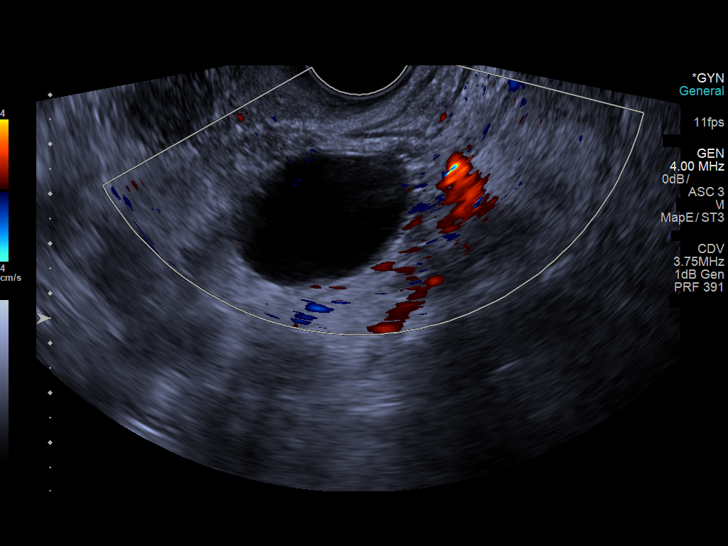
[im 48/48]
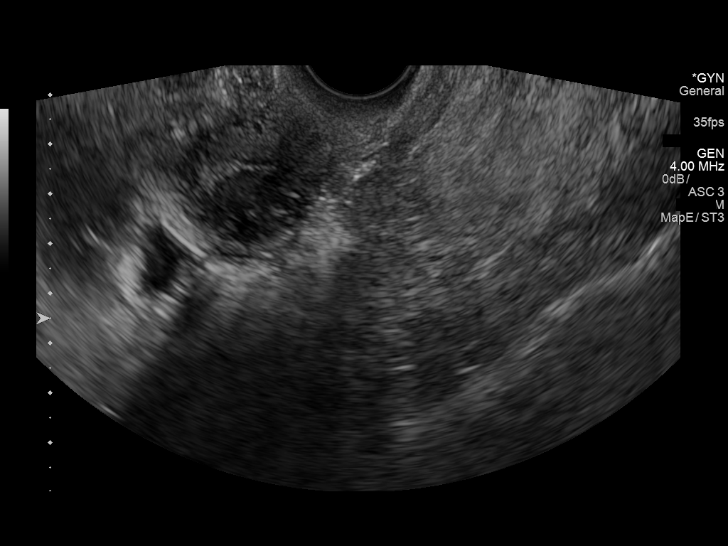

[14 of 25 positions shown; findings below may reference images not displayed]

FINDINGS: Uterus

Measurements: 6.7 x 2.7 x 4.1 cm. Heterogeneous parenchyma but no
focal mass.

Endometrium

Thickness: 3.5 mm.  No focal abnormality visualized.

Right ovary

Measurements: Not visualized.

Left ovary

Measurements: The left ovary measures 4.7 x 2.9 x 4.0 cm and
contains a simple cyst measuring 4.1 x 2.6 x 3.5 cm. There are no
solid elements with color Doppler imaging. There are no thick
septations.

Other findings

No abnormal free fluid.  No pelvic mass.
IMPRESSION: There is a 4.1 cm simple cyst in the left ovary. Annual follow-up is
recommended for postmenopausal patients.

The right ovary was not visualized limiting the study.

## 2019-05-30 ENCOUNTER — Ambulatory Visit: Payer: Medicare Other

## 2020-01-14 ENCOUNTER — Ambulatory Visit: Payer: Medicare Other | Admitting: Plastic Surgery

## 2020-01-14 ENCOUNTER — Other Ambulatory Visit: Payer: Self-pay

## 2020-01-14 ENCOUNTER — Encounter: Payer: Self-pay | Admitting: Plastic Surgery

## 2020-01-14 DIAGNOSIS — Z719 Counseling, unspecified: Secondary | ICD-10-CM

## 2020-01-14 NOTE — Progress Notes (Signed)
Patient ID: Cassidy Rodriguez, female    DOB: 10-08-1945, 74 y.o.   MRN: [id number]   Chief Complaint  Patient presents with   Advice Only    The patient is a very sweet 74 year old white female here for evaluation of her arms.  The patient is interested in a brachioplasty.  She is focusing on her health and has been losing weight.  As she has lost the weight she realizes she has excess fat and skin in her upper arms.  She also has a history of atrial fibrillation and she is taking Coumadin for this.  She typically takes 5 mg on Monday Wednesday Friday and 2.5 mg on Tuesdays and Thursdays.  She does not have any kids and her husband passed away a few years ago.   Review of Systems  Constitutional: Negative.   HENT: Negative.   Eyes: Negative.   Respiratory: Negative.   Cardiovascular: Negative.   Gastrointestinal: Negative.   Endocrine: Negative.   Genitourinary: Negative.   Musculoskeletal: Negative.   Skin: Negative for rash and wound.  Hematological: Negative.     Past Medical History:  Diagnosis Date   Brachial neuritis    Chronic insomnia    History of osteopenia    Hypertension    Hypothyroidism    Longstanding persistent atrial fibrillation (HCC)    Osteopenia    Restless leg    Seasonal allergies    Snoring    Vitamin D deficiency     Past Surgical History:  Procedure Laterality Date   BTL     CHOLECYSTECTOMY     DILATION AND CURETTAGE OF UTERUS        Current Outpatient Medications:    artificial tears (LACRILUBE) OINT ophthalmic ointment, Place 1 application into both eyes every 4 (four) hours as needed for dry eyes., Disp: , Rfl:    cholecalciferol (VITAMIN D) 1000 units tablet, Take 2,000 Units by mouth daily., Disp: , Rfl:    DIGOX 125 MCG tablet, TK 1 T PO ONCE D FOR HEART RATE, Disp: , Rfl: 1   DIGOXIN PO, Take 250 mcg by mouth daily., Disp: , Rfl:    doxepin (SINEQUAN) 10 MG capsule, Take 10 mg by mouth at bedtime as needed  (sleep). , Disp: , Rfl:    levothyroxine (SYNTHROID, LEVOTHROID) 75 MCG tablet, Take 75 mcg by mouth daily before breakfast., Disp: , Rfl:    metoprolol succinate (TOPROL-XL) 50 MG 24 hr tablet, Take 50 mg by mouth daily. Take with or immediately following a meal., Disp: , Rfl:    warfarin (COUMADIN) 5 MG tablet, Take 2.5-5 mg by mouth daily. Take 5 mg on Monday,Wednesday, and Friday. All other days take 2.5 mg, Disp: , Rfl:   Current Facility-Administered Medications:    betamethasone acetate-betamethasone sodium phosphate (CELESTONE) injection 3 mg, 3 mg, Intramuscular, Once, Gala Lewandowsky M, DPM   Objective:   Vitals:   01/14/20 1104  BP: 131/89  Pulse: 69  Temp: 97.9 F (36.6 C)  SpO2: 97%    Physical Exam Vitals and nursing note reviewed.  Constitutional:      Appearance: Normal appearance.  HENT:     Head: Normocephalic and atraumatic.  Cardiovascular:     Rate and Rhythm: Normal rate.     Pulses: Normal pulses.  Pulmonary:     Effort: Pulmonary effort is normal.  Neurological:     General: No focal deficit present.     Mental Status: She is alert and oriented  to person, place, and time.  Psychiatric:        Mood and Affect: Mood normal.        Behavior: Behavior normal.     Assessment & Plan:  Encounter for counseling  DVT the patient's cardiac status on anticoagulation in combination with her age surgery is not to be taken lightly.  We talked about this and the patient decided that she would forego surgery.  I think that is the safest thing for her. Pictures were obtained of the patient and placed in the chart with the patient's or guardian's permission.   Alena Bills Markice Torbert, DO

## 2020-05-26 DIAGNOSIS — Z1239 Encounter for other screening for malignant neoplasm of breast: Secondary | ICD-10-CM | POA: Diagnosis not present

## 2020-05-26 DIAGNOSIS — G479 Sleep disorder, unspecified: Secondary | ICD-10-CM | POA: Diagnosis not present

## 2020-05-26 DIAGNOSIS — I10 Essential (primary) hypertension: Secondary | ICD-10-CM | POA: Diagnosis not present

## 2020-05-26 DIAGNOSIS — E559 Vitamin D deficiency, unspecified: Secondary | ICD-10-CM | POA: Diagnosis not present

## 2020-05-26 DIAGNOSIS — Z7901 Long term (current) use of anticoagulants: Secondary | ICD-10-CM | POA: Diagnosis not present

## 2020-05-26 DIAGNOSIS — D6869 Other thrombophilia: Secondary | ICD-10-CM | POA: Diagnosis not present

## 2020-05-26 DIAGNOSIS — I4819 Other persistent atrial fibrillation: Secondary | ICD-10-CM | POA: Diagnosis not present

## 2020-05-26 DIAGNOSIS — E039 Hypothyroidism, unspecified: Secondary | ICD-10-CM | POA: Diagnosis not present

## 2020-05-26 DIAGNOSIS — D692 Other nonthrombocytopenic purpura: Secondary | ICD-10-CM | POA: Diagnosis not present

## 2020-06-12 IMAGING — US US PELVIS COMPLETE WITH TRANSVAGINAL
1 series · 13 of 25 positions shown · non-contrast
Comparison: 10/17/2017

CLINICAL DATA: Adnexal cyst on LEFT, postmenopausal

EXAM:
TRANSABDOMINAL AND TRANSVAGINAL ULTRASOUND OF PELVIS
TECHNIQUE: Both transabdominal and transvaginal ultrasound examinations of the
pelvis were performed. Transabdominal technique was performed for
global imaging of the pelvis including uterus, ovaries, adnexal
regions, and pelvic cul-de-sac. It was necessary to proceed with
endovaginal exam following the transabdominal exam to visualize the
endometrium and ovaries.

[Series 1: us pelvis complete with transvaginal · 0.20mm/px · 45 acquisitions, 13 frames shown]
[im 1/45]
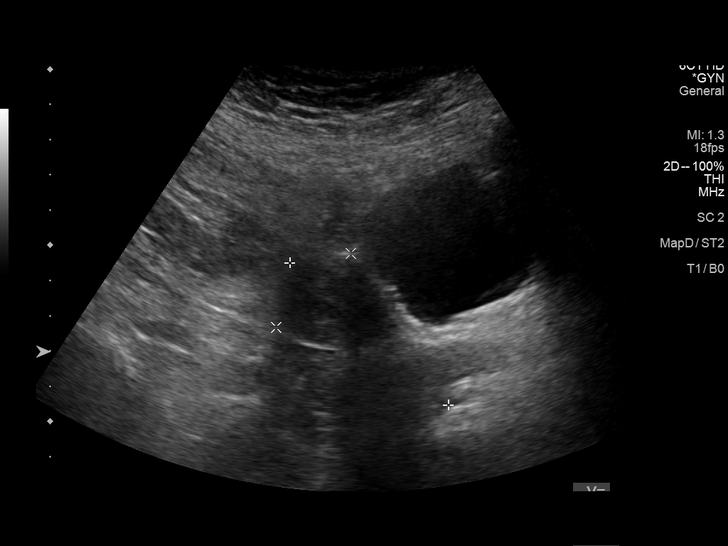
[im 4/45]
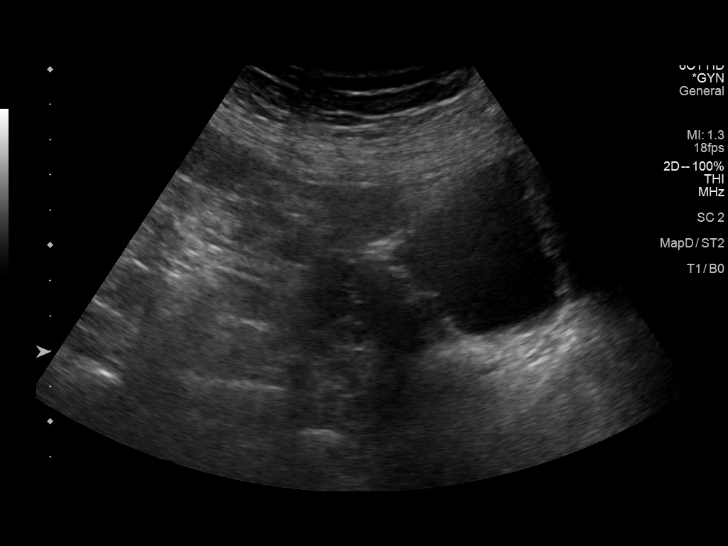
[im 8/45]
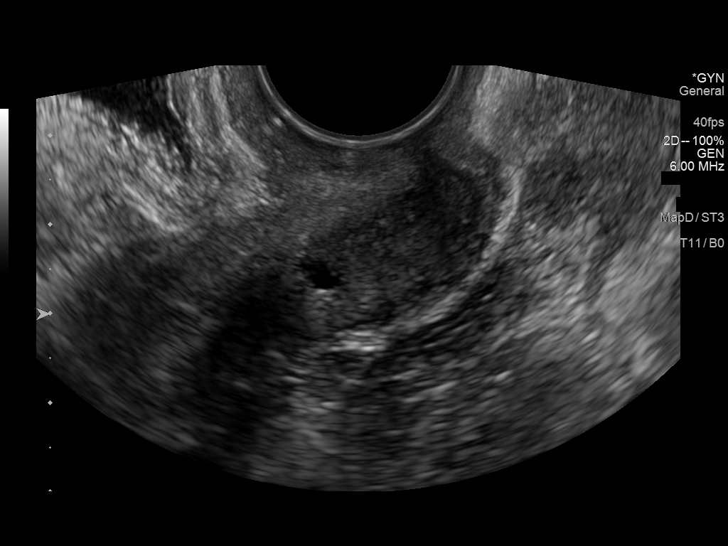
[im 12/45]
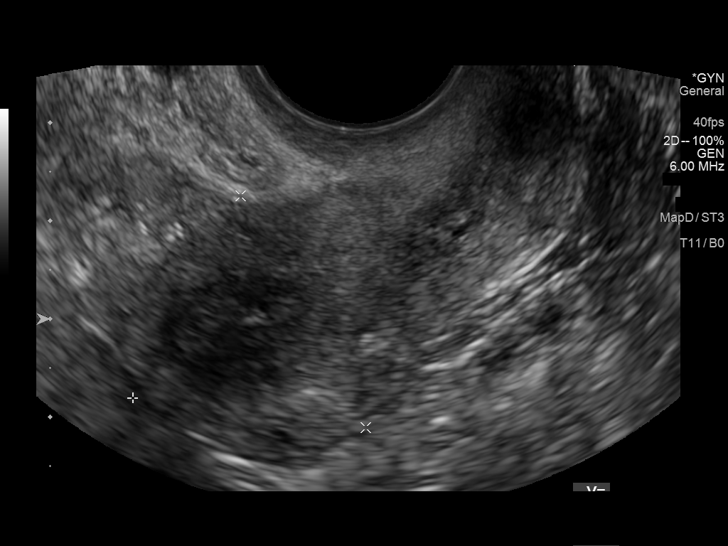
[im 15/45]
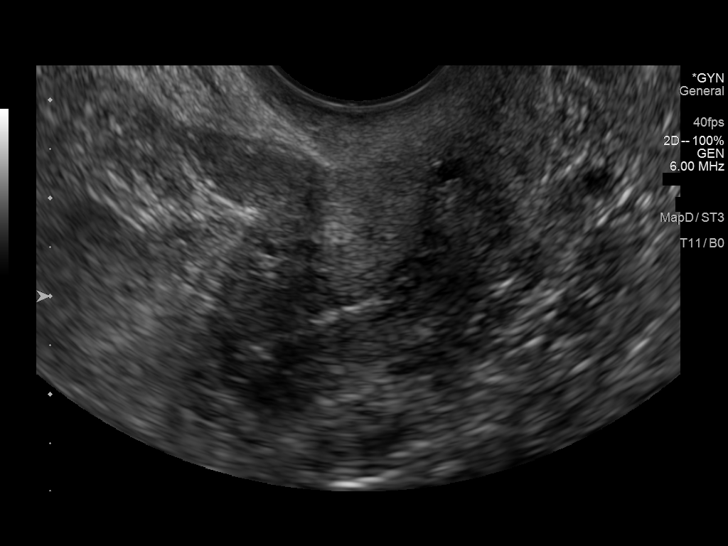
[im 19/45]
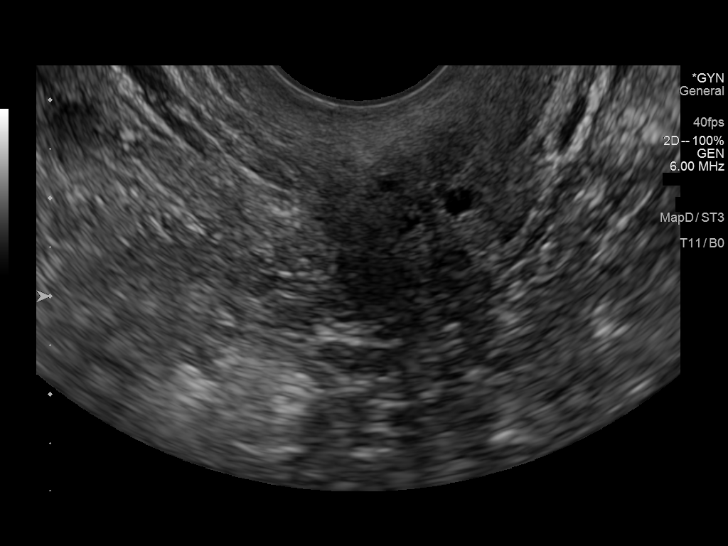
[im 23/45]
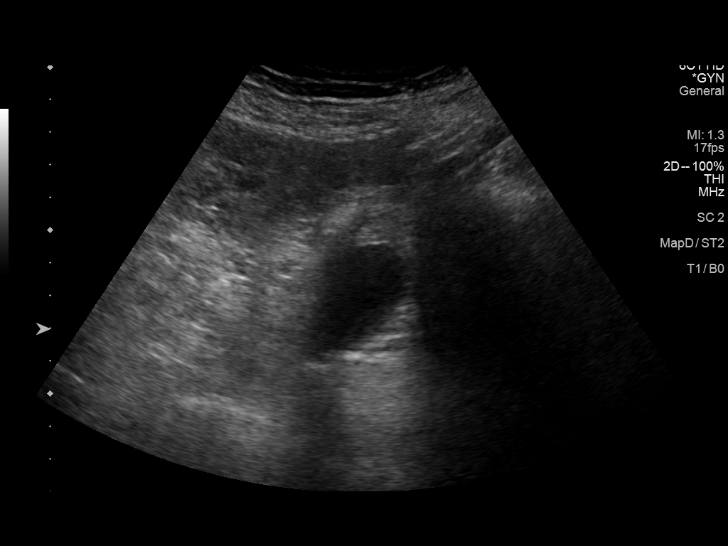
[im 26/45]
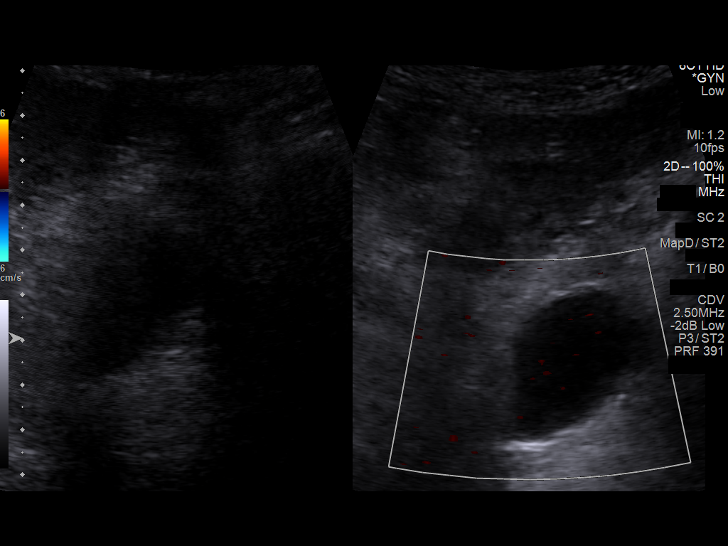
[im 30/45]
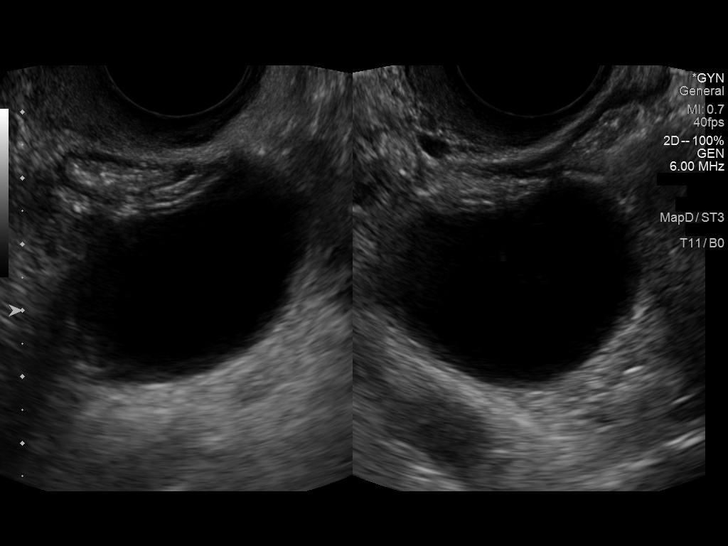
[im 34/45]
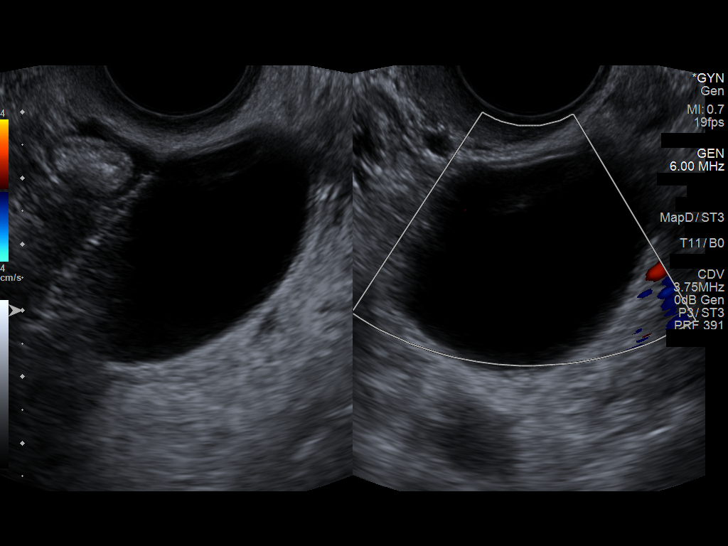
[im 37/45]
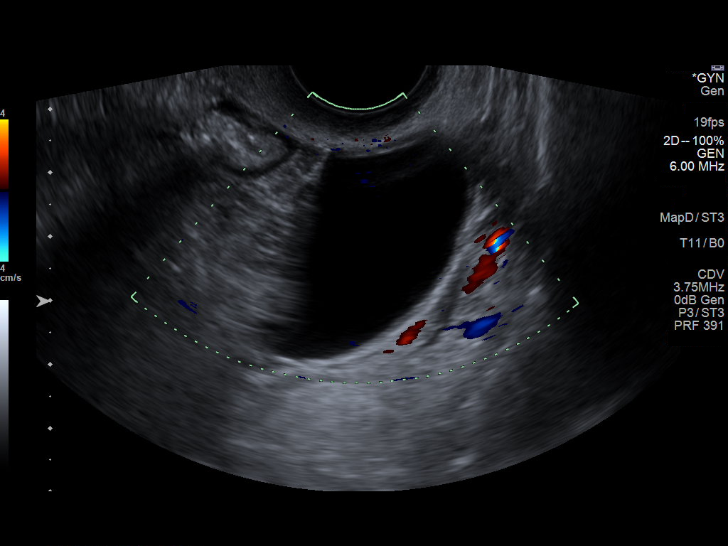
[im 41/45]
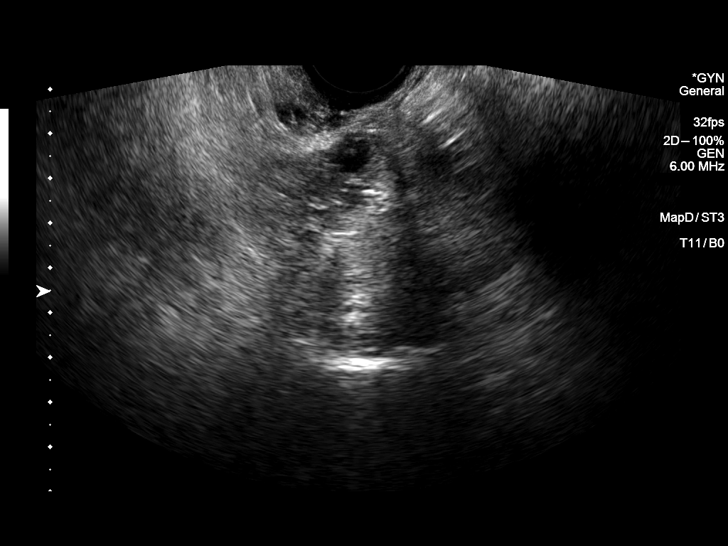
[im 45/45]
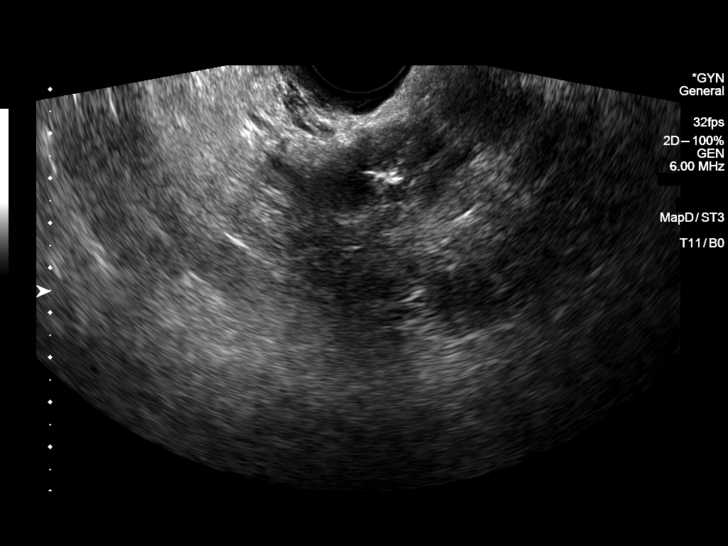

[13 of 25 positions shown; findings below may reference images not displayed]

FINDINGS: Uterus

Measurements: 5.7 x 2.7 x 3.5 cm = volume: 28 mL. Heterogeneous
myometrium. No focal mass.

Endometrium

Thickness: 2 mm.  No endometrial fluid or focal abnormality

Right ovary

Not visualized on either transabdominal or endovaginal imaging,
likely obscured by bowel

Left ovary

Measurements: 4.8 x 2.9 x 4.3 cm = volume: 32 mL. Simple cyst within
LEFT ovary 4.4 x 2.5 x 3.7 cm. No definite mural nodularity or
complicating features.

Other findings

No free pelvic fluid.  No additional adnexal masses.
IMPRESSION: Unremarkable uterus and endometrial complex.

Nonvisualization of RIGHT ovary.

Post menopausal women 4.4 cm diameter simple cyst LEFT ovary, not
significantly changed from the 4.1 cm on the previous exam.

As this is simple in character and stable for 1 year, this is likely
benign and no further workup required.

## 2020-06-30 DIAGNOSIS — Z7901 Long term (current) use of anticoagulants: Secondary | ICD-10-CM | POA: Diagnosis not present

## 2020-08-04 DIAGNOSIS — Z7901 Long term (current) use of anticoagulants: Secondary | ICD-10-CM | POA: Diagnosis not present

## 2020-08-11 DIAGNOSIS — Z7901 Long term (current) use of anticoagulants: Secondary | ICD-10-CM | POA: Diagnosis not present

## 2020-08-25 DIAGNOSIS — Z7901 Long term (current) use of anticoagulants: Secondary | ICD-10-CM | POA: Diagnosis not present

## 2020-09-08 DIAGNOSIS — Z7901 Long term (current) use of anticoagulants: Secondary | ICD-10-CM | POA: Diagnosis not present

## 2020-09-10 DIAGNOSIS — H04123 Dry eye syndrome of bilateral lacrimal glands: Secondary | ICD-10-CM | POA: Diagnosis not present

## 2020-09-10 DIAGNOSIS — Z961 Presence of intraocular lens: Secondary | ICD-10-CM | POA: Diagnosis not present

## 2020-09-10 DIAGNOSIS — H5203 Hypermetropia, bilateral: Secondary | ICD-10-CM | POA: Diagnosis not present

## 2020-09-10 DIAGNOSIS — H52223 Regular astigmatism, bilateral: Secondary | ICD-10-CM | POA: Diagnosis not present

## 2020-09-10 DIAGNOSIS — H524 Presbyopia: Secondary | ICD-10-CM | POA: Diagnosis not present

## 2020-09-23 DIAGNOSIS — Z7901 Long term (current) use of anticoagulants: Secondary | ICD-10-CM | POA: Diagnosis not present

## 2020-11-10 DIAGNOSIS — D692 Other nonthrombocytopenic purpura: Secondary | ICD-10-CM | POA: Diagnosis not present

## 2020-11-10 DIAGNOSIS — Z1389 Encounter for screening for other disorder: Secondary | ICD-10-CM | POA: Diagnosis not present

## 2020-11-10 DIAGNOSIS — E039 Hypothyroidism, unspecified: Secondary | ICD-10-CM | POA: Diagnosis not present

## 2020-11-10 DIAGNOSIS — I10 Essential (primary) hypertension: Secondary | ICD-10-CM | POA: Diagnosis not present

## 2020-11-10 DIAGNOSIS — E559 Vitamin D deficiency, unspecified: Secondary | ICD-10-CM | POA: Diagnosis not present

## 2020-11-10 DIAGNOSIS — G479 Sleep disorder, unspecified: Secondary | ICD-10-CM | POA: Diagnosis not present

## 2020-11-10 DIAGNOSIS — I4819 Other persistent atrial fibrillation: Secondary | ICD-10-CM | POA: Diagnosis not present

## 2020-11-10 DIAGNOSIS — Z1239 Encounter for other screening for malignant neoplasm of breast: Secondary | ICD-10-CM | POA: Diagnosis not present

## 2020-11-10 DIAGNOSIS — Z0000 Encounter for general adult medical examination without abnormal findings: Secondary | ICD-10-CM | POA: Diagnosis not present

## 2020-11-10 DIAGNOSIS — R7301 Impaired fasting glucose: Secondary | ICD-10-CM | POA: Diagnosis not present

## 2020-11-10 DIAGNOSIS — D6869 Other thrombophilia: Secondary | ICD-10-CM | POA: Diagnosis not present

## 2020-11-10 DIAGNOSIS — Z7901 Long term (current) use of anticoagulants: Secondary | ICD-10-CM | POA: Diagnosis not present

## 2020-12-16 DIAGNOSIS — Z7901 Long term (current) use of anticoagulants: Secondary | ICD-10-CM | POA: Diagnosis not present

## 2021-01-20 DIAGNOSIS — Z7901 Long term (current) use of anticoagulants: Secondary | ICD-10-CM | POA: Diagnosis not present

## 2021-02-24 DIAGNOSIS — Z7901 Long term (current) use of anticoagulants: Secondary | ICD-10-CM | POA: Diagnosis not present

## 2021-02-24 DIAGNOSIS — Z23 Encounter for immunization: Secondary | ICD-10-CM | POA: Diagnosis not present

## 2021-03-31 DIAGNOSIS — Z7901 Long term (current) use of anticoagulants: Secondary | ICD-10-CM | POA: Diagnosis not present

## 2021-05-19 DIAGNOSIS — D6869 Other thrombophilia: Secondary | ICD-10-CM | POA: Diagnosis not present

## 2021-05-19 DIAGNOSIS — Z7901 Long term (current) use of anticoagulants: Secondary | ICD-10-CM | POA: Diagnosis not present

## 2021-05-19 DIAGNOSIS — I4819 Other persistent atrial fibrillation: Secondary | ICD-10-CM | POA: Diagnosis not present

## 2021-05-19 DIAGNOSIS — E039 Hypothyroidism, unspecified: Secondary | ICD-10-CM | POA: Diagnosis not present

## 2021-05-19 DIAGNOSIS — G479 Sleep disorder, unspecified: Secondary | ICD-10-CM | POA: Diagnosis not present

## 2021-05-19 DIAGNOSIS — I10 Essential (primary) hypertension: Secondary | ICD-10-CM | POA: Diagnosis not present

## 2021-05-19 DIAGNOSIS — Z1239 Encounter for other screening for malignant neoplasm of breast: Secondary | ICD-10-CM | POA: Diagnosis not present

## 2021-05-28 DIAGNOSIS — H16141 Punctate keratitis, right eye: Secondary | ICD-10-CM | POA: Diagnosis not present

## 2021-06-23 DIAGNOSIS — Z7901 Long term (current) use of anticoagulants: Secondary | ICD-10-CM | POA: Diagnosis not present

## 2021-07-05 ENCOUNTER — Ambulatory Visit: Payer: Medicare Other | Admitting: Podiatry

## 2021-07-05 ENCOUNTER — Other Ambulatory Visit: Payer: Self-pay

## 2021-07-05 ENCOUNTER — Ambulatory Visit (INDEPENDENT_AMBULATORY_CARE_PROVIDER_SITE_OTHER): Payer: Medicare Other

## 2021-07-05 DIAGNOSIS — M722 Plantar fascial fibromatosis: Secondary | ICD-10-CM

## 2021-07-05 MED ORDER — BETAMETHASONE SOD PHOS & ACET 6 (3-3) MG/ML IJ SUSP
3.0000 mg | Freq: Once | INTRAMUSCULAR | Status: AC
  Administered 2021-07-05: 3 mg via INTRA_ARTICULAR

## 2021-07-05 NOTE — Progress Notes (Signed)
? ?  HPI: 76 y.o. female presenting today as a reestablish new patient for evaluation of pain and tenderness to the lateral aspect of the left heel.  Patient states it has been hurting for a few months now.  She is concerned that possibly it is the way she walks or it could be due to a pair shoes that she wore.  She denies a history of injury.  She has not done anything for treatment.  She presents for further treatment and evaluation ? ?Past Medical History:  ?Diagnosis Date  ? Brachial neuritis   ? Chronic insomnia   ? History of osteopenia   ? Hypertension   ? Hypothyroidism   ? Longstanding persistent atrial fibrillation (HCC)   ? Osteopenia   ? Restless leg   ? Seasonal allergies   ? Snoring   ? Vitamin D deficiency   ? ? ?Past Surgical History:  ?Procedure Laterality Date  ? BTL    ? CHOLECYSTECTOMY    ? DILATION AND CURETTAGE OF UTERUS    ? ? ?Allergies  ?Allergen Reactions  ? Compazine [Prochlorperazine Edisylate] Other (See Comments)  ?  Muscle spasm   ? ?  ?Physical Exam: ?General: The patient is alert and oriented x3 in no acute distress. ? ?Dermatology: Skin is warm, dry and supple bilateral lower extremities. Negative for open lesions or macerations. ? ?Vascular: Palpable pedal pulses bilaterally. Capillary refill within normal limits.  Negative for any significant edema or erythema ? ?Neurological: Light touch and protective threshold grossly intact ? ?Musculoskeletal Exam: No pedal deformities noted.  There is pain on palpation to the lateral aspect of the heel left ? ?Radiographic Exam:  ?Normal osseous mineralization. Joint spaces preserved. No fracture/dislocation/boney destruction.   ? ?Assessment: ?1.  Plantar fasciitis left lateral band ? ? ?Plan of Care:  ?1. Patient evaluated. X-Rays reviewed.  ?2.  Injection of 0.5 cc Celestone Soluspan injected into the lateral aspect of the left heel ?3.  Patient on Coumadin 5 mg daily.  No NSAIDs prescribed ?4.  Recommend good supportive shoes and sneakers  that do not irritate the heel ?5.  Return to clinic as needed ? ?  ?  ?Felecia Shelling, DPM ?[facility] ? ?Dr. Felecia Shelling, DPM  ?  ?[address]                                        ?[place], [place] [address]                ?Office [phone]  ?Fax [phone] ? ? ? ? ? ? ? ?

## 2021-07-12 DIAGNOSIS — L739 Follicular disorder, unspecified: Secondary | ICD-10-CM | POA: Diagnosis not present

## 2021-07-28 DIAGNOSIS — Z7901 Long term (current) use of anticoagulants: Secondary | ICD-10-CM | POA: Diagnosis not present

## 2021-08-04 DIAGNOSIS — Z7901 Long term (current) use of anticoagulants: Secondary | ICD-10-CM | POA: Diagnosis not present

## 2021-08-18 DIAGNOSIS — Z7901 Long term (current) use of anticoagulants: Secondary | ICD-10-CM | POA: Diagnosis not present

## 2021-09-14 DIAGNOSIS — Z7901 Long term (current) use of anticoagulants: Secondary | ICD-10-CM | POA: Diagnosis not present

## 2021-10-18 DIAGNOSIS — Z7901 Long term (current) use of anticoagulants: Secondary | ICD-10-CM | POA: Diagnosis not present

## 2021-11-08 DIAGNOSIS — E039 Hypothyroidism, unspecified: Secondary | ICD-10-CM | POA: Diagnosis not present

## 2021-11-08 DIAGNOSIS — D6869 Other thrombophilia: Secondary | ICD-10-CM | POA: Diagnosis not present

## 2021-11-08 DIAGNOSIS — Z0000 Encounter for general adult medical examination without abnormal findings: Secondary | ICD-10-CM | POA: Diagnosis not present

## 2021-11-08 DIAGNOSIS — Z7901 Long term (current) use of anticoagulants: Secondary | ICD-10-CM | POA: Diagnosis not present

## 2021-11-08 DIAGNOSIS — Z13220 Encounter for screening for lipoid disorders: Secondary | ICD-10-CM | POA: Diagnosis not present

## 2021-11-08 DIAGNOSIS — I4819 Other persistent atrial fibrillation: Secondary | ICD-10-CM | POA: Diagnosis not present

## 2021-11-08 DIAGNOSIS — I10 Essential (primary) hypertension: Secondary | ICD-10-CM | POA: Diagnosis not present

## 2021-11-08 DIAGNOSIS — G479 Sleep disorder, unspecified: Secondary | ICD-10-CM | POA: Diagnosis not present

## 2021-11-08 DIAGNOSIS — Z86010 Personal history of colonic polyps: Secondary | ICD-10-CM | POA: Diagnosis not present

## 2021-11-08 DIAGNOSIS — Z136 Encounter for screening for cardiovascular disorders: Secondary | ICD-10-CM | POA: Diagnosis not present

## 2021-11-08 DIAGNOSIS — Z1239 Encounter for other screening for malignant neoplasm of breast: Secondary | ICD-10-CM | POA: Diagnosis not present

## 2021-11-09 DIAGNOSIS — H524 Presbyopia: Secondary | ICD-10-CM | POA: Diagnosis not present

## 2021-11-09 DIAGNOSIS — H53143 Visual discomfort, bilateral: Secondary | ICD-10-CM | POA: Diagnosis not present

## 2021-11-17 ENCOUNTER — Encounter: Payer: Self-pay | Admitting: Cardiology

## 2021-11-17 DIAGNOSIS — Z006 Encounter for examination for normal comparison and control in clinical research program: Secondary | ICD-10-CM

## 2021-11-17 DIAGNOSIS — I10 Essential (primary) hypertension: Secondary | ICD-10-CM

## 2021-11-17 DIAGNOSIS — I4821 Permanent atrial fibrillation: Secondary | ICD-10-CM

## 2021-11-17 NOTE — Progress Notes (Signed)
  Chief Complaint  Patient presents with   Research    OCEANIC-AF (Asundexian - factor XIa inhibitor PO BID vs Apixaban PO BID in patients with A. Fib - 11/17/2021    Patient screened into OCEANIC-AF (Asundexian - factor XIa inhibitor PO BID vs Apixaban PO BID in patients with A. Fib for stroke prevention.   Research exam: OCEANIC-AF (Asundexian - factor XIa inhibitor PO BID vs Apixaban PO BID in patients with A. Fib - 11/17/2021  Permanent atrial fibrillation (HCC)  Primary hypertension  CHA2DS2-VASc Score is 4.  Yearly risk of stroke: 5% (F, A, HTN).  Score of 1=0.6; 2=2.2; 3=3.2; 4=4.8; 5=7.2; 6=9.8; 7=>9.8) -(CHF; HTN; vasc disease DM,  Female = 1; Age <65 =0; 65-74 = 1,  >75 =2; stroke/embolism= 2).    Cholesterol, total 207.000 m 11/08/2021 HDL 41.000 mg 11/08/2021 LDL 129.000 m 11/08/2021 Triglycerides 208.000 m 11/08/2021  A1C 5.700 % 11/10/2020 TSH 3.080 11/08/2021  Creatinine, Serum 0.780 mg/ 11/08/2021 Potassium 4.500 mm 11/08/2021 ALT (SGPT) 10.000 U/L 11/08/2021   INR 1.900 11/08/2021 INR today 11/17/2021: 2.5  Patient was advised to hold warfarin for the next 3 days that is 7/26 - 11/19/2021 and on 11/20/2021 we will start with Eliquis 5 mg p.o. twice daily.  2 weeks samples given.  She will come for randomization visit.   Yates Decamp, MD, [facility] 11/17/2021, 2:04 PM Office: [phone] Fax: [phone] Pager: [phone]

## 2022-03-28 ENCOUNTER — Other Ambulatory Visit (HOSPITAL_COMMUNITY): Payer: Self-pay

## 2022-05-11 ENCOUNTER — Other Ambulatory Visit: Payer: Self-pay

## 2022-05-11 MED ORDER — APIXABAN 5 MG PO TABS: 5.0000 mg | ORAL_TABLET | Freq: Two times a day (BID) | ORAL | 1 refills | Status: AC

## 2022-05-16 DIAGNOSIS — Z86010 Personal history of colonic polyps: Secondary | ICD-10-CM | POA: Diagnosis not present

## 2022-05-16 DIAGNOSIS — I4819 Other persistent atrial fibrillation: Secondary | ICD-10-CM | POA: Diagnosis not present

## 2022-05-16 DIAGNOSIS — G479 Sleep disorder, unspecified: Secondary | ICD-10-CM | POA: Diagnosis not present

## 2022-05-16 DIAGNOSIS — D6869 Other thrombophilia: Secondary | ICD-10-CM | POA: Diagnosis not present

## 2022-05-16 DIAGNOSIS — I10 Essential (primary) hypertension: Secondary | ICD-10-CM | POA: Diagnosis not present

## 2022-05-16 DIAGNOSIS — Z1239 Encounter for other screening for malignant neoplasm of breast: Secondary | ICD-10-CM | POA: Diagnosis not present

## 2022-05-16 DIAGNOSIS — E039 Hypothyroidism, unspecified: Secondary | ICD-10-CM | POA: Diagnosis not present

## 2022-08-02 DIAGNOSIS — L430 Hypertrophic lichen planus: Secondary | ICD-10-CM | POA: Diagnosis not present

## 2022-08-02 DIAGNOSIS — D485 Neoplasm of uncertain behavior of skin: Secondary | ICD-10-CM | POA: Diagnosis not present

## 2022-11-22 DIAGNOSIS — Z0000 Encounter for general adult medical examination without abnormal findings: Secondary | ICD-10-CM | POA: Diagnosis not present

## 2022-11-22 DIAGNOSIS — D6869 Other thrombophilia: Secondary | ICD-10-CM | POA: Diagnosis not present

## 2022-11-22 DIAGNOSIS — Z86010 Personal history of colonic polyps: Secondary | ICD-10-CM | POA: Diagnosis not present

## 2022-11-22 DIAGNOSIS — G479 Sleep disorder, unspecified: Secondary | ICD-10-CM | POA: Diagnosis not present

## 2022-11-22 DIAGNOSIS — E039 Hypothyroidism, unspecified: Secondary | ICD-10-CM | POA: Diagnosis not present

## 2022-11-22 DIAGNOSIS — I4819 Other persistent atrial fibrillation: Secondary | ICD-10-CM | POA: Diagnosis not present

## 2022-11-22 DIAGNOSIS — I700 Atherosclerosis of aorta: Secondary | ICD-10-CM | POA: Diagnosis not present

## 2022-11-22 DIAGNOSIS — Z1239 Encounter for other screening for malignant neoplasm of breast: Secondary | ICD-10-CM | POA: Diagnosis not present

## 2022-11-22 DIAGNOSIS — I10 Essential (primary) hypertension: Secondary | ICD-10-CM | POA: Diagnosis not present

## 2023-02-28 DIAGNOSIS — H16141 Punctate keratitis, right eye: Secondary | ICD-10-CM | POA: Diagnosis not present

## 2023-02-28 DIAGNOSIS — H53143 Visual discomfort, bilateral: Secondary | ICD-10-CM | POA: Diagnosis not present

## 2023-02-28 DIAGNOSIS — H524 Presbyopia: Secondary | ICD-10-CM | POA: Diagnosis not present

## 2023-10-01 NOTE — Progress Notes (Signed)
 Cardiology Office Note:  .   Date:  10/02/2023  ID:  Cassidy Rodriguez, DOB 07-30-1945, MRN [id number] PCP: Elida Grounds, DO   HeartCare Providers Cardiologist:  Knox Perl, MD   History of Present Illness: .   Cassidy Rodriguez is a 78 y.o. patient with permanent atrial fibrillation, primary hypertension referred to establish care.  Patient had remotely seen Dr. Jolly Needle in 2017 at which time she had normal echocardiogram with EF 55 to 60% with mild to moderate MR and mild left atrial enlargement PA pressure was 35 mmHg.  Continued anticoagulation was recommended with consideration for LAA appendage closure if contraindication to anticoagulation exist.  Discussed the use of AI scribe software for clinical note transcription with the patient, who gave verbal consent to proceed.  History of Present Illness Cassidy Rodriguez is an 78 year old female with atrial fibrillation who presents for reestablishment of care with a cardiologist.  She has atrial fibrillation and is currently taking digoxin , metoprolol succinate in the morning, and Eliquis  both in the morning and at night. She experiences shortness of breath when ascending inclines, particularly in hot weather. She adheres to her medication regimen closely, ensuring she does not miss any doses.  Labs   External Labs:  PCP faxed labs 11/22/2022:  Serum glucose 106 mg, BUN 13, creatinine 0.94, EGFR 62 mL, potassium 4.2.  TSH normal at 2.52.  Labs 11/09/2021:  Total cholesterol 207, triglycerides 208, HDL 41, LDL 129.  ROS  Review of Systems  Cardiovascular:  Positive for dyspnea on exertion (mild and chronic). Negative for chest pain, leg swelling, orthopnea, palpitations and paroxysmal nocturnal dyspnea.    Physical Exam:   VS:  BP 132/80   Pulse 86   Ht 5\' 2"  (1.575 m)   Wt 186 lb (84.4 kg)   LMP  (LMP Unknown)   SpO2 97%   BMI 34.02 kg/m    Wt Readings from Last 3 Encounters:  10/02/23 186 lb (84.4 kg)   09/13/17 165 lb (74.8 kg)  02/29/16 166 lb (75.3 kg)    Physical Exam Constitutional:      Appearance: She is obese.  Neck:     Vascular: No carotid bruit or JVD.  Cardiovascular:     Rate and Rhythm: Normal rate. Rhythm irregular.     Pulses: Normal pulses and intact distal pulses.     Heart sounds: No murmur heard. Pulmonary:     Effort: Pulmonary effort is normal.     Breath sounds: Normal breath sounds.  Abdominal:     General: Bowel sounds are normal.     Palpations: Abdomen is soft.  Musculoskeletal:     Right lower leg: No edema.     Left lower leg: No edema.  Skin:    Capillary Refill: Capillary refill takes less than 2 seconds.   Studies Reviewed: Aaron Aas    ECHOCARDIOGRAM COMPLETE 03/21/2016   Left ventricle: The cavity size was normal. There was mild concentric hypertrophy. Systolic function was normal. The estimated ejection fraction was in the range of 55% to 60%. - Mitral valve: There was mild to moderate regurgitation directed centrally. - Left atrium: The atrium was mildly dilated. - Right ventricle: The cavity size was mildly dilated. - Right atrium: The atrium was moderately dilated. - Pulmonary arteries: Systolic pressure was mildly increased. PA peak pressure: 35 mm Hg (S).   EKG:    EKG Interpretation Date/Time:  Monday October 02 2023 13:38:22 EDT Ventricular Rate:  102 PR  Interval:    QRS Duration:  82 QT Interval:  380 QTC Calculation: 495 R Axis:   -62  Text Interpretation: EKG 10/02/2023: Atrial fibrillation with RVR at 102 bpm, LAFB, IRBBB, poor R wave progression, low-voltage complexes, nonspecific T abnormality. Consider pulmonary disease pattern. Confirmed by Caleb Decock, Jagadeesh ([id number]) on 10/02/2023 1:46:16 PM  Compared to 02/29/2016, RVR new otherwise no change.  Medications and allergies    Allergies  Allergen Reactions   Compazine [Prochlorperazine Edisylate] Other (See Comments)    Muscle spasm      Current Outpatient Medications:     apixaban  (ELIQUIS ) 5 MG TABS tablet, Take 1 tablet (5 mg total) by mouth 2 (two) times daily., Disp: 60 tablet, Rfl: 1   artificial tears (LACRILUBE) OINT ophthalmic ointment, Place 1 application into both eyes every 4 (four) hours as needed for dry eyes., Disp: , Rfl:    cholecalciferol (VITAMIN D) 1000 units tablet, Take 2,000 Units by mouth daily., Disp: , Rfl:    diltiazem (CARDIZEM CD) 120 MG 24 hr capsule, Take 1 capsule (120 mg total) by mouth every evening., Disp: 90 capsule, Rfl: 0   doxepin (SINEQUAN) 10 MG capsule, Take 10 mg by mouth at bedtime as needed (sleep). , Disp: , Rfl:    levothyroxine (SYNTHROID, LEVOTHROID) 75 MCG tablet, Take 75 mcg by mouth daily before breakfast., Disp: , Rfl:    metoprolol succinate (TOPROL-XL) 50 MG 24 hr tablet, Take 50 mg by mouth daily. Take with or immediately following a meal., Disp: , Rfl:    Meds ordered this encounter  Medications   diltiazem (CARDIZEM CD) 120 MG 24 hr capsule    Sig: Take 1 capsule (120 mg total) by mouth every evening.    Dispense:  90 capsule    Refill:  0    Discontinue Digoxin      Medications Discontinued During This Encounter  Medication Reason   DIGOX  125 MCG tablet Dose change   betamethasone  acetate-betamethasone  sodium phosphate (CELESTONE ) injection 3 mg    digoxin  (LANOXIN ) 0.125 MG tablet Change in therapy     ASSESSMENT AND PLAN: .      ICD-10-CM   1. Permanent atrial fibrillation (HCC)  I48.21 diltiazem (CARDIZEM CD) 120 MG 24 hr capsule    2. Primary hypertension  I10 EKG 12-Lead     Click Here to Calculate/Change CHADS2VASc Score The patient's CHADS2-VASc score is 4, indicating a 4.8% annual risk of stroke.  Therefore, anticoagulation is recommended.   CHF History: No HTN History: Yes Diabetes History: No Stroke History: No Vascular Disease History: No  Assessment and Plan Assessment & Plan Permanent atrial fibrillation Permanent atrial fibrillation with previously elevated heart rate.  Current management includes metoprolol succinate 50 mg once daily. Digoxin  has been used long-term but is not preferred due to monitoring requirements and potential side effects, especially in older patients. Plan to discontinue digoxin  and initiate diltiazem CD 120 mg once daily to better control heart rate and potentially improve symptoms of exertional dyspnea. Target heart rate is 70s to 80s bpm. She is agreeable to medication changes and understands the rationale. The new medication is expected to lower heart rate without the adverse effects associated with digoxin . - Discontinue digoxin . - Initiate diltiazem CD 120 mg once daily, starting two days after stopping digoxin . - Continue metoprolol succinate 50 mg once daily. - Order EKG in three months to assess heart rate control. - Order echocardiogram to evaluate heart function and valve status. - Monitor blood pressure as combination  therapy may improve control.  Primary hypertension Blood pressure is well-controlled on metoprolol.  However I am also adding low-dose of diltiazem CD 120 mg daily.  Although 78 years of age, A-fib is permanent and has been uncontrolled with regard to heart rate, hopefully this will also improve and continue to keep the blood pressure stable as well.  Knee pain and leg stiffness Reports knee pain and leg stiffness, possibly related to age and decreased physical activity. No prior evaluation by an orthopedic specialist. Plans to increase physical activity by joining a gym with her sister. - Encourage increased physical activity as tolerated.  Signed,  Knox Perl, MD, [facility] 10/02/2023, 2:03 PM [facility] [address] [place], [place] [address] Phone: [phone]. Fax:  [phone]

## 2023-10-02 ENCOUNTER — Ambulatory Visit: Attending: Cardiology | Admitting: Cardiology

## 2023-10-02 ENCOUNTER — Encounter: Payer: Self-pay | Admitting: Cardiology

## 2023-10-02 VITALS — BP 132/80 | HR 86 | Ht 62.0 in | Wt 186.0 lb

## 2023-10-02 DIAGNOSIS — I4821 Permanent atrial fibrillation: Secondary | ICD-10-CM

## 2023-10-02 DIAGNOSIS — R0609 Other forms of dyspnea: Secondary | ICD-10-CM | POA: Diagnosis not present

## 2023-10-02 DIAGNOSIS — I10 Essential (primary) hypertension: Secondary | ICD-10-CM

## 2023-10-02 MED ORDER — DILTIAZEM HCL ER COATED BEADS 120 MG PO CP24: 120.0000 mg | ORAL_CAPSULE | Freq: Every evening | ORAL | 0 refills | Status: DC

## 2023-10-02 NOTE — Patient Instructions (Addendum)
 Medication Instructions:  Discontinue your Lanoxin . Please start Diltiazem 120 mg daily. Continue all other medications as listed.  *If you need a refill on your cardiac medications before your next appointment, please call your pharmacy*  Procedures: Your physician has requested that you have an echocardiogram. Echocardiography is a painless test that uses sound waves to create images of your heart. It provides your doctor with information about the size and shape of your heart and how well your heart's chambers and valves are working. This procedure takes approximately one hour. There are no restrictions for this procedure. Please do NOT wear cologne, perfume, aftershave, or lotions (deodorant is allowed). Please arrive 15 minutes prior to your appointment time.  Please note: We ask at that you not bring children with you during ultrasound (echo/ vascular) testing. Due to room size and safety concerns, children are not allowed in the ultrasound rooms during exams. Our front office staff cannot provide observation of children in our lobby area while testing is being conducted. An adult accompanying a patient to their appointment will only be allowed in the ultrasound room at the discretion of the ultrasound technician under special circumstances. We apologize for any inconvenience.  Follow-Up: At [facility], you and your health needs are our priority.  As part of our continuing mission to provide you with exceptional heart care, our providers are all part of one team.  This team includes your primary Cardiologist (physician) and Advanced Practice Providers or APPs (Physician Assistants and Nurse Practitioners) who all work together to provide you with the care you need, when you need it.  Your next appointment:   3 month   Provider:   Knox Perl, MD    We recommend signing up for the patient portal called "MyChart".  Sign up information is provided on this After Visit Summary.   MyChart is used to connect with patients for Virtual Visits (Telemedicine).  Patients are able to view lab/test results, encounter notes, upcoming appointments, etc.  Non-urgent messages can be sent to your provider as well.   To learn more about what you can do with MyChart, go to [email or web address].

## 2023-10-04 DIAGNOSIS — H00025 Hordeolum internum left lower eyelid: Secondary | ICD-10-CM | POA: Diagnosis not present

## 2023-11-20 ENCOUNTER — Ambulatory Visit: Payer: Self-pay | Admitting: Cardiology

## 2023-11-20 ENCOUNTER — Ambulatory Visit (HOSPITAL_COMMUNITY)
Admission: RE | Admit: 2023-11-20 | Discharge: 2023-11-20 | Disposition: A | Discharge status: Home / Self Care | Source: Ambulatory Visit | Attending: Cardiology | Admitting: Cardiology

## 2023-11-20 DIAGNOSIS — I4821 Permanent atrial fibrillation: Secondary | ICD-10-CM | POA: Diagnosis not present

## 2023-11-20 DIAGNOSIS — R0609 Other forms of dyspnea: Secondary | ICD-10-CM | POA: Diagnosis not present

## 2023-11-20 LAB — ECHOCARDIOGRAM COMPLETE
Area-P 1/2: 5.21 cm2
MV M vel: 4.21 m/s
MV Peak grad: 70.7 mmHg
Radius: 0.6 cm
S' Lateral: 2.6 cm

## 2023-11-20 NOTE — Progress Notes (Signed)
 NOrmal echo with minor abnormality. Will discuss on OV

## 2023-12-05 DIAGNOSIS — D6869 Other thrombophilia: Secondary | ICD-10-CM | POA: Diagnosis not present

## 2023-12-05 DIAGNOSIS — I4819 Other persistent atrial fibrillation: Secondary | ICD-10-CM | POA: Diagnosis not present

## 2023-12-05 DIAGNOSIS — E039 Hypothyroidism, unspecified: Secondary | ICD-10-CM | POA: Diagnosis not present

## 2023-12-05 DIAGNOSIS — E782 Mixed hyperlipidemia: Secondary | ICD-10-CM | POA: Diagnosis not present

## 2023-12-05 DIAGNOSIS — Z0000 Encounter for general adult medical examination without abnormal findings: Secondary | ICD-10-CM | POA: Diagnosis not present

## 2023-12-05 DIAGNOSIS — I10 Essential (primary) hypertension: Secondary | ICD-10-CM | POA: Diagnosis not present

## 2023-12-05 DIAGNOSIS — F5109 Other insomnia not due to a substance or known physiological condition: Secondary | ICD-10-CM | POA: Diagnosis not present

## 2023-12-06 ENCOUNTER — Other Ambulatory Visit: Payer: Self-pay | Admitting: Cardiology

## 2023-12-06 DIAGNOSIS — I4821 Permanent atrial fibrillation: Secondary | ICD-10-CM

## 2024-01-04 ENCOUNTER — Ambulatory Visit: Admitting: Cardiology

## 2024-01-18 ENCOUNTER — Encounter: Payer: Self-pay | Admitting: Cardiology

## 2024-01-18 ENCOUNTER — Ambulatory Visit: Attending: Cardiology | Admitting: Cardiology

## 2024-01-18 VITALS — BP 134/76 | HR 98 | Ht 62.0 in | Wt 189.6 lb

## 2024-01-18 DIAGNOSIS — I10 Essential (primary) hypertension: Secondary | ICD-10-CM

## 2024-01-18 DIAGNOSIS — I4821 Permanent atrial fibrillation: Secondary | ICD-10-CM | POA: Diagnosis not present

## 2024-01-18 DIAGNOSIS — I340 Nonrheumatic mitral (valve) insufficiency: Secondary | ICD-10-CM | POA: Diagnosis not present

## 2024-01-18 MED ORDER — LOSARTAN POTASSIUM 25 MG PO TABS: 25.0000 mg | ORAL_TABLET | ORAL | 3 refills | Status: AC

## 2024-01-18 NOTE — Progress Notes (Signed)
 Cardiology Office Note:  .   Date:  01/18/2024  ID:  Cassidy Rodriguez, DOB 1946/03/15, MRN [id number] PCP: Auston Opal, DO  [facility] Providers Cardiologist:  Gordy Bergamo, MD   History of Present Illness: .   Cassidy Rodriguez is a 78 y.o. patient with permanent atrial fibrillation, primary hypertension who established with me on 10/02/2023, I had discontinued digoxin  and started her on diltiazem  CD 120 mg daily and I had continued metoprolol succinate 50 mg daily.  She underwent echocardiogram and now presents for follow-up.  She is tolerating diltiazem  however states that it has caused her to have constipation and hair loss.  She is happy that she is off of Lanoxin .  Otherwise remains asymptomatic.  Contemplating knee replacement surgery.  Cardiac Studies relevent.    ECHOCARDIOGRAM COMPLETE 11/20/2023  . Left ventricular ejection fraction, by estimation, is 55 to 60%. The left ventricle has normal function. The left ventricle has no regional wall motion abnormalities. Left ventricular diastolic function could not be evaluated. 2. Right ventricular systolic function is mildly reduced. The right ventricular size is normal. There is normal pulmonary artery systolic pressure. The estimated right ventricular systolic pressure is 32.4 mmHg. 3. Left atrial size was severely dilated. 4. Right atrial size was severely dilated. 5. The mitral valve is normal in structure. Moderate mitral valve regurgitation. No evidence of mitral stenosis. Moderate mitral annular calcification. 6.  No significant change from 03/21/2016.   Discussed the use of AI scribe software for clinical note transcription with the patient, who gave verbal consent to proceed.  History of Present Illness Cassidy Rodriguez is an 78 year old female who presents for cardiovascular follow-up and medication management.  She recently stopped taking digoxin  and is experiencing side effects from her current medication, including  constipation and hair loss. She takes metoprolol, adjusted to nighttime to avoid daytime drowsiness, and Eliquis  at night. She denies shortness of breath, chest pain, or palpitations.   Labs   Care everywhere/Faxed External Labs:  PCP faxed labs 11/22/2022:   Serum glucose 106 mg, BUN 13, creatinine 0.94, EGFR 62 mL, potassium 4.2.   TSH normal at 2.52.   Labs 11/09/2021:   Total cholesterol 207, triglycerides 208, HDL 41, LDL 129.  ROS  Review of Systems  Cardiovascular:  Negative for chest pain, dyspnea on exertion and leg swelling.  Musculoskeletal:  Positive for joint pain.   Physical Exam:   VS:  BP 134/76 (BP Location: Left Arm, Patient Position: Sitting, Cuff Size: Large)   Pulse 98   Ht 5' 2 (1.575 m)   Wt 189 lb 9.6 oz (86 kg)   LMP  (LMP Unknown)   SpO2 98%   BMI 34.68 kg/m    Wt Readings from Last 3 Encounters:  01/18/24 189 lb 9.6 oz (86 kg)  10/02/23 186 lb (84.4 kg)  09/13/17 165 lb (74.8 kg)    BP Readings from Last 3 Encounters:  01/18/24 134/76  10/02/23 132/80  01/14/20 131/89   Physical Exam Constitutional:      Appearance: She is obese.  Neck:     Vascular: No carotid bruit or JVD.  Cardiovascular:     Rate and Rhythm: Normal rate. Rhythm irregular.     Pulses: Normal pulses and intact distal pulses.     Heart sounds: No murmur heard. Pulmonary:     Effort: Pulmonary effort is normal.     Breath sounds: Normal breath sounds.  Abdominal:     General: Bowel  sounds are normal.     Palpations: Abdomen is soft.  Musculoskeletal:     Right lower leg: No edema.     Left lower leg: No edema.  Skin:    Capillary Refill: Capillary refill takes less than 2 seconds.    EKG:         ASSESSMENT AND PLAN: .      ICD-10-CM   1. Permanent atrial fibrillation (HCC)  I48.21     2. Primary hypertension  I10 losartan  (COZAAR ) 25 MG tablet    Basic metabolic panel with GFR    3. Moderate mitral regurgitation  I34.0      Assessment &  Plan Permanent atrial fibrillation She experiences constipation and hair loss from diltiazem , leading to its discontinuation. She continues Eliquis  at night. - Discontinue diltiazem  - Continue Eliquis  at night - Heart rate is well-controlled  Essential hypertension Essential hypertension is managed with medication. Blood pressure is well-controlled. Losartan  is introduced to replace diltiazem  to better protect kidneys and heart. 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 - No significant change in moderate mitral regurgitation from echocardiogram in 2017.  Obesity Obesity is addressed with lifestyle modifications. Weight loss is encouraged for overall health and potential knee surgery. She is advised on portion control and water exercises at the [facility] pool, with emphasis on not eating immediately after swimming. - Encourage weight loss through portion control and healthy eating - Advise engaging in water exercises at the [facility] pool - Instruct not to eat for an hour after swimming   Follow up: As needed.  As she has remained stable from cardiac standpoint, heart rate is well-controlled, blood pressure is well-controlled, risk factors are well-controlled, except for obesity.  Hence can be followed up by her PCP and see me back on a as needed basis.  Will request PCP to take over all the prescriptions as well.  Signed,  Gordy Bergamo, MD, [facility] 01/18/2024, 9:45 PM [facility] [address] [place], [place] [address] Phone: [phone]. Fax:  [phone]

## 2024-01-18 NOTE — Patient Instructions (Signed)
 Medication Instructions:  START Losartan  25 mg in the AM   *If you need a refill on your cardiac medications before your next appointment, please call your pharmacy*  Lab Work: BMP IN 2 WEEKS   If you have labs (blood work) drawn today and your tests are completely normal, you will receive your results only by: MyChart Message (if you have MyChart) OR A paper copy in the mail If you have any lab test that is abnormal or we need to change your treatment, we will call you to review the results.   Follow-Up: At [facility], you and your health needs are our priority.  As part of our continuing mission to provide you with exceptional heart care, our providers are all part of one team.  This team includes your primary Cardiologist (physician) and Advanced Practice Providers or APPs (Physician Assistants and Nurse Practitioners) who all work together to provide you with the care you need, when you need it.  Your next appointment:   6 month(s)  Provider:   Gordy Bergamo, MD

## 2024-02-02 DIAGNOSIS — M25562 Pain in left knee: Secondary | ICD-10-CM | POA: Diagnosis not present

## 2024-02-08 DIAGNOSIS — I10 Essential (primary) hypertension: Secondary | ICD-10-CM | POA: Diagnosis not present

## 2024-02-09 ENCOUNTER — Ambulatory Visit: Payer: Self-pay | Admitting: Cardiology

## 2024-02-09 LAB — BASIC METABOLIC PANEL WITH GFR
BUN/Creatinine Ratio: 18 (ref 12–28)
BUN: 17 mg/dL (ref 8–27)
CO2: 23 mmol/L (ref 20–29)
Calcium: 9.9 mg/dL (ref 8.7–10.3)
Chloride: 98 mmol/L (ref 96–106)
Creatinine, Ser: 0.96 mg/dL (ref 0.57–1.00)
Glucose: 93 mg/dL (ref 70–99)
Potassium: 4.8 mmol/L (ref 3.5–5.2)
Sodium: 137 mmol/L (ref 134–144)
eGFR: 60 mL/min/1.73 (ref >59)

## 2024-02-19 DIAGNOSIS — M25562 Pain in left knee: Secondary | ICD-10-CM | POA: Diagnosis not present
